# Patient Record
Sex: Male | Born: 1985 | Race: White | Hispanic: No | Marital: Married | Smoking: Never smoker
Health system: Southern US, Community
[De-identification: ages and names within clinical notes are randomized; demographics above are authoritative.]

---

## 2009-09-29 ENCOUNTER — Emergency Department (HOSPITAL_COMMUNITY): Admission: EM | Admit: 2009-09-29 | Discharge: 2009-09-29 | Payer: Self-pay | Admitting: Emergency Medicine

## 2011-01-11 LAB — STREP A DNA PROBE: Group A Strep Probe: NEGATIVE

## 2016-06-02 ENCOUNTER — Emergency Department: Payer: Worker's Compensation

## 2016-06-02 ENCOUNTER — Encounter: Payer: Self-pay | Admitting: Medical Oncology

## 2016-06-02 ENCOUNTER — Emergency Department
Admission: EM | Admit: 2016-06-02 | Discharge: 2016-06-02 | Disposition: A | Discharge status: Home / Self Care | Payer: Worker's Compensation | Attending: Emergency Medicine | Admitting: Emergency Medicine

## 2016-06-02 DIAGNOSIS — S39012A Strain of muscle, fascia and tendon of lower back, initial encounter: Secondary | ICD-10-CM

## 2016-06-02 DIAGNOSIS — X58XXXA Exposure to other specified factors, initial encounter: Secondary | ICD-10-CM | POA: Diagnosis not present

## 2016-06-02 DIAGNOSIS — M545 Low back pain: Secondary | ICD-10-CM | POA: Diagnosis present

## 2016-06-02 DIAGNOSIS — Y929 Unspecified place or not applicable: Secondary | ICD-10-CM | POA: Diagnosis not present

## 2016-06-02 DIAGNOSIS — Y9389 Activity, other specified: Secondary | ICD-10-CM | POA: Diagnosis not present

## 2016-06-02 DIAGNOSIS — Y990 Civilian activity done for income or pay: Secondary | ICD-10-CM | POA: Diagnosis not present

## 2016-06-02 DIAGNOSIS — M62830 Muscle spasm of back: Secondary | ICD-10-CM

## 2016-06-02 MED ORDER — IBUPROFEN 800 MG PO TABS: 800.0000 mg | ORAL_TABLET | Freq: Three times a day (TID) | ORAL | 0 refills | Status: DC | PRN

## 2016-06-02 MED ORDER — CYCLOBENZAPRINE HCL 10 MG PO TABS: 10.0000 mg | ORAL_TABLET | Freq: Three times a day (TID) | ORAL | 0 refills | Status: DC | PRN

## 2016-06-02 MED ORDER — HYDROMORPHONE HCL 1 MG/ML IJ SOLN
1.0000 mg | Freq: Once | INTRAMUSCULAR | Status: AC
  Administered 2016-06-02: 1 mg via INTRAMUSCULAR
  Filled 2016-06-02: qty 1

## 2016-06-02 MED ORDER — HYDROCODONE-ACETAMINOPHEN 5-325 MG PO TABS: 1.0000 | ORAL_TABLET | ORAL | 0 refills | Status: DC | PRN

## 2016-06-02 MED ORDER — KETOROLAC TROMETHAMINE 60 MG/2ML IM SOLN
60.0000 mg | Freq: Once | INTRAMUSCULAR | Status: AC
  Administered 2016-06-02: 60 mg via INTRAMUSCULAR
  Filled 2016-06-02: qty 2

## 2016-06-02 MED ORDER — DIAZEPAM 5 MG/ML IJ SOLN
5.0000 mg | Freq: Once | INTRAMUSCULAR | Status: AC
  Administered 2016-06-02: 5 mg via INTRAMUSCULAR
  Filled 2016-06-02: qty 2

## 2016-06-02 MED ORDER — PREDNISONE 10 MG PO TABS: 50.0000 mg | ORAL_TABLET | Freq: Every day | ORAL | 0 refills | Status: DC

## 2016-06-02 NOTE — ED Notes (Signed)
Pt in via triage; pt reports being at work today, stepping out of truck and feeling a sharp, shooting pain in lower back.  Pt denies any known injury, pt reports hx of pulled muscle a few years ago.  Pt A/Ox4, lying in bed face down for comfort.  Pt ambulatory to room, appears uncomfortable but no immediate distress noted at this time.

## 2016-06-02 NOTE — ED Provider Notes (Signed)
[facility] Emergency Department Provider Note  ____________________________________________  Time seen: Approximately 10:21 AM  I have reviewed the triage vital signs and the nursing notes.   HISTORY  Chief Complaint Back Pain    HPI Junius ArgyleRandy W Akard is a 30 y.o. male presents for evaluation of pain in his lower back. Patient states that he was getting out of his car when he felt a pain shooting across his lower back. States it happened this morning at work. Denies any other trauma. Denies any lifting straining or turning.   History reviewed. No pertinent past medical history.  There are no active problems to display for this patient.   History reviewed. No pertinent surgical history.  Prior to Admission medications   Medication Sig Start Date End Date Taking? Authorizing Provider  cyclobenzaprine (FLEXERIL) 10 MG tablet Take 1 tablet (10 mg total) by mouth 3 (three) times daily as needed for muscle spasms. 06/02/16   Evangeline Dakinharles M Beers, PA-C  HYDROcodone-acetaminophen (NORCO) 5-325 MG tablet Take 1-2 tablets by mouth every 4 (four) hours as needed for moderate pain. 06/02/16   Charmayne Sheerharles M Beers, PA-C  ibuprofen (ADVIL,MOTRIN) 800 MG tablet Take 1 tablet (800 mg total) by mouth every 8 (eight) hours as needed. 06/02/16   Evangeline Dakinharles M Beers, PA-C  predniSONE (DELTASONE) 10 MG tablet Take 5 tablets (50 mg total) by mouth daily with breakfast. 06/02/16   Evangeline Dakinharles M Beers, PA-C    Allergies Review of patient's allergies indicates no known allergies.  No family history on file.  Social History Social History  Substance Use Topics  . Smoking status: Not on file  . Smokeless tobacco: Not on file  . Alcohol use Not on file    Review of Systems Constitutional: No fever/chills Cardiovascular: Denies chest pain. Respiratory: Denies shortness of breath. Musculoskeletal: Positive for low back pain. Skin: Negative for rash. Neurological: Negative for headaches, focal  weakness or numbness.  10-point ROS otherwise negative.  ____________________________________________   PHYSICAL EXAM:  VITAL SIGNS: ED Triage Vitals  Enc Vitals Group     BP 06/02/16 0955 (!) 156/92     Pulse Rate 06/02/16 0955 78     Resp 06/02/16 0955 18     Temp --      Temp Source 06/02/16 0955 Oral     SpO2 06/02/16 0955 99 %     Weight 06/02/16 0931 263 lb (119.3 kg)     Height 06/02/16 0931 5\' 10"  (1.778 m)     Head Circumference --      Peak Flow --      Pain Score 06/02/16 0931 7     Pain Loc --      Pain Edu? --      Excl. in [place]? --     Constitutional: Alert and oriented. Well appearing and in Mild acute distress. Cardiovascular: Normal rate, regular rhythm. Grossly normal heart sounds.  Good peripheral circulation. Respiratory: Normal respiratory effort.  No retractions. Lungs CTAB. Musculoskeletal: Patient is lying in the prone position on the gurney and unable to rotate onto his back. Neurologic:  Normal speech and language. No gross focal neurologic deficits are appreciated. No gait instability. Skin:  Skin is warm, dry and intact. No rash noted. Psychiatric: Mood and affect are normal. Speech and behavior are normal.  ____________________________________________   LABS (all labs ordered are listed, but only abnormal results are displayed)  Labs Reviewed - No data to display ____________________________________________  EKG   ____________________________________________  RADIOLOGY  No acute osseous findings noted on x-ray. ____________________________________________   PROCEDURES  Procedure(s) performed: None  Critical Care performed: No  ____________________________________________   INITIAL IMPRESSION / ASSESSMENT AND PLAN / ED COURSE  Pertinent labs & imaging results that were available during my care of the patient were reviewed by me and considered in my medical decision making (see chart for details). Review of the [place] CSRS was  performed in accordance of the [facility] prior to dispensing any controlled drugs.  Acute lumbosacral strain. Rx given for Flexeril 10 mg 3 times a day, Percocet 5/325, Naprosyn 500 mg twice a day. Work excuse 48 hours. Patient follow-up with PCP or return to ER with any worsening symptomology.  Clinical Course  ----------------------------------------- 12:19 PM on 06/02/2016 -----------------------------------------  Patient still complaining of low back pain despite Valium and Toradol. 1 mg Dilaudid IM to be given at this time.  ____________________________________________   FINAL CLINICAL IMPRESSION(S) / ED DIAGNOSES  Final diagnoses:  Lumbar strain, initial encounter  Muscle spasm of back     This chart was dictated using voice recognition software/Dragon. Despite best efforts to proofread, errors can occur which can change the meaning. Any change was purely unintentional.    Evangeline Dakinharles M Beers, PA-C 06/02/16 1314    Emily FilbertJonathan E Williams, MD 06/02/16 [phone]

## 2016-06-02 NOTE — ED Triage Notes (Signed)
Pt was at work when he bent over and injured his lower back. WC needed.

## 2019-09-18 ENCOUNTER — Other Ambulatory Visit: Payer: Self-pay

## 2019-09-18 ENCOUNTER — Emergency Department
Admission: EM | Admit: 2019-09-18 | Discharge: 2019-09-18 | Disposition: A | Discharge status: Home / Self Care | Payer: BLUE CROSS/BLUE SHIELD | Attending: Emergency Medicine | Admitting: Emergency Medicine

## 2019-09-18 ENCOUNTER — Encounter: Payer: Self-pay | Admitting: Emergency Medicine

## 2019-09-18 DIAGNOSIS — R42 Dizziness and giddiness: Secondary | ICD-10-CM | POA: Diagnosis not present

## 2019-09-18 LAB — COMPREHENSIVE METABOLIC PANEL
ALT: 38 U/L (ref 0–44)
AST: 22 U/L (ref 15–41)
Albumin: 4.4 g/dL (ref 3.5–5.0)
Alkaline Phosphatase: 45 U/L (ref 38–126)
Anion gap: 9 (ref 5–15)
BUN: 12 mg/dL (ref 6–20)
CO2: 26 mmol/L (ref 22–32)
Calcium: 9 mg/dL (ref 8.9–10.3)
Chloride: 105 mmol/L (ref 98–111)
Creatinine, Ser: 1.03 mg/dL (ref 0.61–1.24)
GFR calc Af Amer: >60 mL/min (ref >60)
GFR calc non Af Amer: >60 mL/min (ref >60)
Glucose, Bld: 118 mg/dL — ABNORMAL HIGH (ref 70–99)
Potassium: 3.9 mmol/L (ref 3.5–5.1)
Sodium: 140 mmol/L (ref 135–145)
Total Bilirubin: 0.6 mg/dL (ref 0.3–1.2)
Total Protein: 7.5 g/dL (ref 6.5–8.1)

## 2019-09-18 LAB — CBC
HCT: 42.8 % (ref 39.0–52.0)
Hemoglobin: 14.6 g/dL (ref 13.0–17.0)
MCH: 29 pg (ref 26.0–34.0)
MCHC: 34.1 g/dL (ref 30.0–36.0)
MCV: 84.9 fL (ref 80.0–100.0)
Platelets: 187 10*3/uL (ref 150–400)
RBC: 5.04 MIL/uL (ref 4.22–5.81)
RDW: 12.5 % (ref 11.5–15.5)
WBC: 5.7 10*3/uL (ref 4.0–10.5)
nRBC: 0 % (ref 0.0–0.2)

## 2019-09-18 MED ORDER — ONDANSETRON 4 MG PO TBDP: 4.0000 mg | ORAL_TABLET | Freq: Three times a day (TID) | ORAL | 0 refills | Status: DC | PRN

## 2019-09-18 NOTE — ED Provider Notes (Signed)
[facility] Emergency Department Provider Note   ____________________________________________    I have reviewed the triage vital signs and the nursing notes.   HISTORY  Chief Complaint Dizziness     HPI Wesley Goodman is a 33 y.o. male who presents with complaints of dizziness.  He reports this is been occurring for 2 to 3 days now.  He was seen via virtual visit with physician who suspected vertigo and start him on meclizine, he reports mild improvement with this.  He describes a sensation of unsteadiness when moving his head especially.  Some nausea no vomiting.  No history of the same.  No head injury.  No neuro deficits.  No headache.  Here today because symptoms have continued  History reviewed. No pertinent past medical history.  There are no active problems to display for this patient.   History reviewed. No pertinent surgical history.  Prior to Admission medications   Medication Sig Start Date End Date Taking? Authorizing Provider  cyclobenzaprine (FLEXERIL) 10 MG tablet Take 1 tablet (10 mg total) by mouth 3 (three) times daily as needed for muscle spasms. 06/02/16   Beers, Pierce Crane, PA-C  HYDROcodone-acetaminophen (NORCO) 5-325 MG tablet Take 1-2 tablets by mouth every 4 (four) hours as needed for moderate pain. 06/02/16   Beers, Pierce Crane, PA-C  ibuprofen (ADVIL,MOTRIN) 800 MG tablet Take 1 tablet (800 mg total) by mouth every 8 (eight) hours as needed. 06/02/16   Beers, Pierce Crane, PA-C  ondansetron (ZOFRAN ODT) 4 MG disintegrating tablet Take 1 tablet (4 mg total) by mouth every 8 (eight) hours as needed. 09/18/19   Lavonia Drafts, MD  predniSONE (DELTASONE) 10 MG tablet Take 5 tablets (50 mg total) by mouth daily with breakfast. 06/02/16   Arlyss Repress, PA-C     Allergies Patient has no known allergies.  No family history on file.  Social History Social History   Tobacco Use  . Smoking status: Not on file  Substance Use Topics  .  Alcohol use: Not on file  . Drug use: Not on file    Review of Systems  Constitutional: No fever/chills Eyes: No visual changes.  ENT: No sore throat. Cardiovascular: Denies palpitations Respiratory: Denies shortness of breath. Gastrointestinal: No abdominal pain.   Genitourinary: Negative for dysuria. Musculoskeletal: No neck pain Skin: Negative for rash. Neurological: No headaches, no focal deficits   ____________________________________________   PHYSICAL EXAM:  VITAL SIGNS: ED Triage Vitals  Enc Vitals Group     BP 09/18/19 0825 (!) 148/98     Pulse Rate 09/18/19 0825 74     Resp 09/18/19 0825 16     Temp 09/18/19 0825 98 F (36.7 C)     Temp Source 09/18/19 0825 Oral     SpO2 09/18/19 0825 98 %     Weight 09/18/19 0827 113.4 kg (250 lb)     Height 09/18/19 0827 1.753 m (5\' 9" )     Head Circumference --      Peak Flow --      Pain Score 09/18/19 0832 0     Pain Loc --      Pain Edu? --      Excl. in [place]? --     Constitutional: Alert and oriented. Ears: Cerumen bilaterally inhibits visualization of TMs Eyes: Horizontal nystagmus left gaze Nose: No congestion/rhinnorhea. Mouth/Throat: Mucous membranes are moist.   Neck:  Painless ROM Cardiovascular: Normal rate, regular rhythm.  Good peripheral circulation. Respiratory: Normal respiratory  effort.  No retractions. Gastrointestinal: Soft and nontender. No distention.    Musculoskeletal:.  Warm and well perfused Neurologic:  Normal speech and language. No gross focal neurologic deficits are appreciated.  Skin:  Skin is warm, dry and intact. No rash noted. Psychiatric: Mood and affect are normal. Speech and behavior are normal.  ____________________________________________   LABS (all labs ordered are listed, but only abnormal results are displayed)  Labs Reviewed  COMPREHENSIVE METABOLIC PANEL - Abnormal; Notable for the following components:      Result Value   Glucose, Bld 118 (*)    All other  components within normal limits  CBC  CBG MONITORING, ED   ____________________________________________  EKG  ED ECG REPORT I, Jene Every, the attending physician, personally viewed and interpreted this ECG.  Date: 09/18/2019  Rhythm: normal sinus rhythm QRS Axis: normal Intervals: normal ST/T Wave abnormalities: normal Narrative Interpretation: no evidence of acute ischemia  ____________________________________________  RADIOLOGY  None ____________________________________________   PROCEDURES  Procedure(s) performed: No  Procedures   Critical Care performed: No ____________________________________________   INITIAL IMPRESSION / ASSESSMENT AND PLAN / ED COURSE  Pertinent labs & imaging results that were available during my care of the patient were reviewed by me and considered in my medical decision making (see chart for details).  Patient presents with symptoms consistent with vertigo, overall well-appearing with reassuring exam.  Prominent nystagmus horizontally to the left consistent with BPV.  Already taking meclizine, will add on Zofran, referral to ENT, lab work unremarkable, EKG unremarkable.    ____________________________________________   FINAL CLINICAL IMPRESSION(S) / ED DIAGNOSES  Final diagnoses:  Vertigo        Note:  This document was prepared using Dragon voice recognition software and may include unintentional dictation errors.   Jene Every, MD 09/18/19 1021

## 2019-09-18 NOTE — ED Notes (Signed)
Pt c/o dizziness with movement since Saturday and saw a teleDoc and has a Rx for meclizine yesterday. States it is not helping. States he thinks it is related to his ears, has been having issues with an ache intermittent for a while and has been doing home remedies like washing out with peroxide with minimal relief.

## 2019-09-18 NOTE — ED Triage Notes (Signed)
Pt reports Saturday started feeling dizzy. Pt also reports intermittent pain to both ears and nausea. Pt states called tele MD and was told he probably had vertigo. Pt states feels like the room is spinning.

## 2019-12-07 ENCOUNTER — Other Ambulatory Visit: Payer: Self-pay

## 2019-12-07 ENCOUNTER — Ambulatory Visit: Payer: BLUE CROSS/BLUE SHIELD | Attending: Internal Medicine

## 2019-12-07 DIAGNOSIS — Z20822 Contact with and (suspected) exposure to covid-19: Secondary | ICD-10-CM

## 2019-12-08 LAB — NOVEL CORONAVIRUS, NAA: SARS-CoV-2, NAA: NOT DETECTED

## 2021-09-01 ENCOUNTER — Emergency Department: Payer: Medicaid Other

## 2021-09-01 ENCOUNTER — Encounter: Payer: Self-pay | Admitting: Emergency Medicine

## 2021-09-01 ENCOUNTER — Other Ambulatory Visit: Payer: Self-pay

## 2021-09-01 ENCOUNTER — Emergency Department
Admission: EM | Admit: 2021-09-01 | Discharge: 2021-09-01 | Disposition: A | Discharge status: Home / Self Care | Payer: Medicaid Other | Attending: Emergency Medicine | Admitting: Emergency Medicine

## 2021-09-01 DIAGNOSIS — Z20822 Contact with and (suspected) exposure to covid-19: Secondary | ICD-10-CM | POA: Insufficient documentation

## 2021-09-01 DIAGNOSIS — J40 Bronchitis, not specified as acute or chronic: Secondary | ICD-10-CM | POA: Diagnosis not present

## 2021-09-01 DIAGNOSIS — R059 Cough, unspecified: Secondary | ICD-10-CM | POA: Diagnosis present

## 2021-09-01 LAB — RESP PANEL BY RT-PCR (FLU A&B, COVID) ARPGX2
Influenza A by PCR: NEGATIVE
Influenza B by PCR: NEGATIVE
SARS Coronavirus 2 by RT PCR: NEGATIVE

## 2021-09-01 MED ORDER — AZITHROMYCIN 250 MG PO TABS: ORAL_TABLET | ORAL | 0 refills | Status: AC

## 2021-09-01 MED ORDER — BENZONATATE 100 MG PO CAPS
100.0000 mg | ORAL_CAPSULE | Freq: Three times a day (TID) | ORAL | 0 refills | Status: AC | PRN
Start: 2021-09-01 — End: 2022-09-01

## 2021-09-01 MED ORDER — ALBUTEROL SULFATE HFA 108 (90 BASE) MCG/ACT IN AERS: 2.0000 | INHALATION_SPRAY | Freq: Four times a day (QID) | RESPIRATORY_TRACT | 2 refills | Status: AC | PRN

## 2021-09-01 NOTE — Discharge Instructions (Addendum)
The Zithromax antibiotic 2 pills on the first day and 1 pill every day after that till they are gone.  Use the inhaler 2 puffs 4 times a day as we discussed puffing the inhaler and inhaling it in the middle of a slow long deep breath.  Use the Tessalon Perles swallow 1 3 times a day as needed for cough.  Remember the Jerilynn Som can make you very sick if you suck on them.  Please return for any increasing shortness of breath, fever or feeling sicker.  Please get your doctor to check your blood pressure again a week or 2 after you have gotten better as it is high today.

## 2021-09-01 NOTE — ED Provider Notes (Signed)
[facility] Emergency Department Provider Note   ____________________________________________   Event Date/Time   First MD Initiated Contact with Patient 09/01/21 1313     (approximate)  I have reviewed the triage vital signs and the nursing notes.   HISTORY  Chief Complaint URI    HPI Wesley Goodman is a 35 y.o. male complains of 5 days of cough and cold with chills.  He has 1 child in school and 1 in daycare and both have gotten sick and his wife I believe is also sick and now he is sick as well.  They all have similar symptoms.  Patient is not having any shortness of breath.        History reviewed. No pertinent past medical history.  There are no problems to display for this patient.   History reviewed. No pertinent surgical history.  Prior to Admission medications   Medication Sig Start Date End Date Taking? Authorizing Provider  albuterol (VENTOLIN HFA) 108 (90 Base) MCG/ACT inhaler Inhale 2 puffs into the lungs every 6 (six) hours as needed for wheezing or shortness of breath. 09/01/21  Yes Arnaldo Natal, MD  azithromycin (ZITHROMAX Z-PAK) 250 MG tablet Take 2 tablets (500 mg) on  Day 1,  followed by 1 tablet (250 mg) once daily on Days 2 through 5. 09/01/21 09/06/21 Yes Arnaldo Natal, MD  benzonatate (TESSALON PERLES) 100 MG capsule Take 1 capsule (100 mg total) by mouth 3 (three) times daily as needed for cough. 09/01/21 09/01/22 Yes Arnaldo Natal, MD  cyclobenzaprine (FLEXERIL) 10 MG tablet Take 1 tablet (10 mg total) by mouth 3 (three) times daily as needed for muscle spasms. 06/02/16   Beers, Charmayne Sheer, PA-C  HYDROcodone-acetaminophen (NORCO) 5-325 MG tablet Take 1-2 tablets by mouth every 4 (four) hours as needed for moderate pain. 06/02/16   Beers, Charmayne Sheer, PA-C  ibuprofen (ADVIL,MOTRIN) 800 MG tablet Take 1 tablet (800 mg total) by mouth every 8 (eight) hours as needed. 06/02/16   Beers, Charmayne Sheer, PA-C  ondansetron (ZOFRAN ODT) 4 MG  disintegrating tablet Take 1 tablet (4 mg total) by mouth every 8 (eight) hours as needed. 09/18/19   Jene Every, MD  predniSONE (DELTASONE) 10 MG tablet Take 5 tablets (50 mg total) by mouth daily with breakfast. 06/02/16   Evangeline Dakin, PA-C    Allergies Patient has no known allergies.  No family history on file.  Social History    Review of Systems  Constitutional: No fever/chills Eyes: No visual changes. ENT: No sore throat. Cardiovascular: Denies chest pain. Respiratory: Denies shortness of breath. Gastrointestinal: No abdominal pain.  No nausea, no vomiting.  No diarrhea.  No constipation. Genitourinary: Negative for dysuria. Musculoskeletal: Negative for back pain. Skin: Negative for rash. Neurological: Negative for headaches, focal weakness   ____________________________________________   PHYSICAL EXAM:  VITAL SIGNS: ED Triage Vitals  Enc Vitals Group     BP 09/01/21 1242 (!) 164/106     Pulse Rate 09/01/21 1242 74     Resp 09/01/21 1242 18     Temp 09/01/21 1242 98.5 F (36.9 C)     Temp Source 09/01/21 1242 Oral     SpO2 09/01/21 1242 98 %     Weight 09/01/21 1239 250 lb (113.4 kg)     Height 09/01/21 1239 5\' 9"  (1.753 m)     Head Circumference --      Peak Flow --      Pain Score 09/01/21  1239 0     Pain Loc --      Pain Edu? --      Excl. in [place]? --    Constitutional: Alert and oriented. Well appearing and in no acute distress. Eyes: Conjunctivae are normal. Head: Atraumatic. Nose: No congestion/rhinnorhea. Mouth/Throat: Mucous membranes are moist.  Oropharynx non-erythematous. Neck: No stridor.  Cardiovascular: Normal rate, regular rhythm. Grossly normal heart sounds.  Good peripheral circulation. Respiratory: Normal respiratory effort.  No retractions. Lungs slight crackles in both bases otherwise clear Gastrointestinal: Soft and nontender. No distention. No abdominal bruits.  Musculoskeletal: No lower extremity tenderness n Neurologic:   Normal speech and language. No gross focal neurologic deficits are appreciated.  Skin:  Skin is warm, dry and intact. No rash noted.   ____________________________________________   LABS (all labs ordered are listed, but only abnormal results are displayed)  Labs Reviewed  RESP PANEL BY RT-PCR (FLU A&B, COVID) [id number]   ____________________________________________  EKG   ____________________________________________  RADIOLOGY Jill Poling, personally viewed and evaluated these images (plain radiographs) as part of my medical decision making, as well as reviewing the written report by the radiologist.  ED MD interpretation: Chest x-ray read by radiology reviewed by me is negative  Official radiology report(s): DG Chest 2 View  Result Date: 09/01/2021 CLINICAL DATA:  Cough. EXAM: CHEST - 2 VIEW COMPARISON:  September 29, 2009. FINDINGS: The heart size and mediastinal contours are within normal limits. Both lungs are clear. The visualized skeletal structures are unremarkable. IMPRESSION: No active cardiopulmonary disease. Electronically Signed   By: Lupita Raider M.D.   On: 09/01/2021 13:47    ____________________________________________   PROCEDURES  Procedure(s) performed (including Critical Care):  Procedures   ____________________________________________   INITIAL IMPRESSION / ASSESSMENT AND PLAN / ED COURSE  Patient reports coughing up green phlegm.  He feels like he is getting worse even with here in the emergency department.  He is coughed up some streaks of blood just now.  He does not have any maxillary or frontal sinus tenderness.  He does have complaints of some pain in the ethmoid area.  I will try some Zithromax antibiotic together with albuterol and Tessalon Perles.  He will return if he is worse or not any better in 2 to 3 days.  I will also have him follow-up with his primary care doctor to make sure his blood pressure comes down.               ____________________________________________   FINAL CLINICAL IMPRESSION(S) / ED DIAGNOSES  Final diagnoses:  Bronchitis     ED Discharge Orders          Ordered    albuterol (VENTOLIN HFA) 108 (90 Base) MCG/ACT inhaler  Every 6 hours PRN        09/01/21 1422    azithromycin (ZITHROMAX Z-PAK) 250 MG tablet        09/01/21 1422    benzonatate (TESSALON PERLES) 100 MG capsule  3 times daily PRN        09/01/21 1422             Note:  This document was prepared using Dragon voice recognition software and may include unintentional dictation errors.    Arnaldo Natal, MD 09/01/21 [phone]

## 2021-09-01 NOTE — ED Triage Notes (Signed)
C/O productive cough, sinus congestion, chills x 5 days.

## 2021-09-28 ENCOUNTER — Encounter: Payer: Self-pay | Admitting: Emergency Medicine

## 2021-09-28 ENCOUNTER — Emergency Department: Payer: Medicaid Other

## 2021-09-28 ENCOUNTER — Other Ambulatory Visit: Payer: Self-pay

## 2021-09-28 DIAGNOSIS — U071 COVID-19: Secondary | ICD-10-CM | POA: Insufficient documentation

## 2021-09-28 DIAGNOSIS — R059 Cough, unspecified: Secondary | ICD-10-CM | POA: Diagnosis present

## 2021-09-28 DIAGNOSIS — R000 Tachycardia, unspecified: Secondary | ICD-10-CM | POA: Diagnosis not present

## 2021-09-28 LAB — BASIC METABOLIC PANEL
Anion gap: 9 (ref 5–15)
BUN: 16 mg/dL (ref 6–20)
CO2: 25 mmol/L (ref 22–32)
Calcium: 10.2 mg/dL (ref 8.9–10.3)
Chloride: 99 mmol/L (ref 98–111)
Creatinine, Ser: 1.02 mg/dL (ref 0.61–1.24)
GFR, Estimated: >60 mL/min (ref >60)
Glucose, Bld: 127 mg/dL — ABNORMAL HIGH (ref 70–99)
Potassium: 4.8 mmol/L (ref 3.5–5.1)
Sodium: 133 mmol/L — ABNORMAL LOW (ref 135–145)

## 2021-09-28 LAB — CBC WITH DIFFERENTIAL/PLATELET
Abs Immature Granulocytes: 0.02 10*3/uL (ref 0.00–0.07)
Basophils Absolute: 0.1 10*3/uL (ref 0.0–0.1)
Basophils Relative: 1 %
Eosinophils Absolute: 0.1 10*3/uL (ref 0.0–0.5)
Eosinophils Relative: 1 %
HCT: 44.8 % (ref 39.0–52.0)
Hemoglobin: 15.2 g/dL (ref 13.0–17.0)
Immature Granulocytes: 0 %
Lymphocytes Relative: 9 %
Lymphs Abs: 0.7 10*3/uL (ref 0.7–4.0)
MCH: 28.5 pg (ref 26.0–34.0)
MCHC: 33.9 g/dL (ref 30.0–36.0)
MCV: 83.9 fL (ref 80.0–100.0)
Monocytes Absolute: 0.7 10*3/uL (ref 0.1–1.0)
Monocytes Relative: 9 %
Neutro Abs: 6 10*3/uL (ref 1.7–7.7)
Neutrophils Relative %: 80 %
Platelets: 219 10*3/uL (ref 150–400)
RBC: 5.34 MIL/uL (ref 4.22–5.81)
RDW: 13.1 % (ref 11.5–15.5)
WBC: 7.5 10*3/uL (ref 4.0–10.5)
nRBC: 0 % (ref 0.0–0.2)

## 2021-09-28 LAB — TROPONIN I (HIGH SENSITIVITY): Troponin I (High Sensitivity): 2 ng/L (ref <18)

## 2021-09-28 NOTE — ED Triage Notes (Signed)
Patient ambulatory to triage with steady gait, without difficulty or distress noted; pt reports tonight having dry cough accomp by dizziness and mid CP, nonradiating

## 2021-09-29 ENCOUNTER — Emergency Department
Admission: EM | Admit: 2021-09-29 | Discharge: 2021-09-29 | Disposition: A | Discharge status: Home / Self Care | Payer: Medicaid Other | Attending: Emergency Medicine | Admitting: Emergency Medicine

## 2021-09-29 DIAGNOSIS — R051 Acute cough: Secondary | ICD-10-CM

## 2021-09-29 DIAGNOSIS — R42 Dizziness and giddiness: Secondary | ICD-10-CM

## 2021-09-29 DIAGNOSIS — R0789 Other chest pain: Secondary | ICD-10-CM

## 2021-09-29 DIAGNOSIS — U071 COVID-19: Secondary | ICD-10-CM

## 2021-09-29 LAB — RESP PANEL BY RT-PCR (FLU A&B, COVID) ARPGX2
Influenza A by PCR: NEGATIVE
Influenza B by PCR: NEGATIVE
SARS Coronavirus 2 by RT PCR: POSITIVE — AB

## 2021-09-29 LAB — TROPONIN I (HIGH SENSITIVITY): Troponin I (High Sensitivity): 2 ng/L (ref <18)

## 2021-09-29 MED ORDER — ONDANSETRON 8 MG PO TBDP: 8.0000 mg | ORAL_TABLET | Freq: Three times a day (TID) | ORAL | 0 refills | Status: DC | PRN

## 2021-09-29 MED ORDER — NIRMATRELVIR/RITONAVIR (PAXLOVID)TABLET: 3.0000 | ORAL_TABLET | Freq: Two times a day (BID) | ORAL | 0 refills | Status: AC

## 2021-09-29 NOTE — ED Provider Notes (Signed)
[facility] Emergency Department Provider Note   ____________________________________________   Event Date/Time   First MD Initiated Contact with Patient 09/29/21 [phone]     (approximate)  I have reviewed the triage vital signs and the nursing notes.   HISTORY  Chief Complaint Chest Pain    HPI Wesley Goodman is a 35 y.o. male who presents for cough, chest pain, and lightheadedness  LOCATION: Chest DURATION: 1 day prior to arrival TIMING: Worsening since onset SEVERITY: Severe QUALITY: Chest pain, dry cough CONTEXT: Patient states that beginning last night he began experiencing dry cough, substernal chest pain that he describes as aching as well as orthostatic lightheadedness beginning last night at work MODIFYING FACTORS: Denies any exacerbating or relieving factors and has not tried any medications for the symptoms ASSOCIATED SYMPTOMS: Clear rhinorrhea, shortness of breath   Per medical record review, patient has no listed past medical or surgical history          History reviewed. No pertinent past medical history.  There are no problems to display for this patient.   History reviewed. No pertinent surgical history.  Prior to Admission medications   Medication Sig Start Date End Date Taking? Authorizing Provider  albuterol (VENTOLIN HFA) 108 (90 Base) MCG/ACT inhaler Inhale 2 puffs into the lungs every 6 (six) hours as needed for wheezing or shortness of breath. 09/01/21   Arnaldo Natal, MD  benzonatate (TESSALON PERLES) 100 MG capsule Take 1 capsule (100 mg total) by mouth 3 (three) times daily as needed for cough. 09/01/21 09/01/22  Arnaldo Natal, MD  cyclobenzaprine (FLEXERIL) 10 MG tablet Take 1 tablet (10 mg total) by mouth 3 (three) times daily as needed for muscle spasms. 06/02/16   Beers, Charmayne Sheer, PA-C  HYDROcodone-acetaminophen (NORCO) 5-325 MG tablet Take 1-2 tablets by mouth every 4 (four) hours as needed for moderate pain.  06/02/16   Beers, Charmayne Sheer, PA-C  ibuprofen (ADVIL,MOTRIN) 800 MG tablet Take 1 tablet (800 mg total) by mouth every 8 (eight) hours as needed. 06/02/16   Beers, Charmayne Sheer, PA-C  nirmatrelvir/ritonavir EUA (PAXLOVID) 20 x 150 MG & 10 x 100MG  TABS Take 3 tablets by mouth 2 (two) times daily for 5 days. Patient GFR is WNL. Take nirmatrelvir (150 mg) two tablets twice daily for 5 days and ritonavir (100 mg) one tablet twice daily for 5 days. 09/29/21 10/04/21 Yes 10/06/21, MD  ondansetron (ZOFRAN-ODT) 8 MG disintegrating tablet Take 1 tablet (8 mg total) by mouth every 8 (eight) hours as needed for nausea or vomiting. 09/29/21  Yes 10/01/21, MD  predniSONE (DELTASONE) 10 MG tablet Take 5 tablets (50 mg total) by mouth daily with breakfast. 06/02/16   06/04/16, PA-C    Allergies Patient has no known allergies.  No family history on file.  Social History Social History   Tobacco Use   Smoking status: Never   Smokeless tobacco: Never  Vaping Use   Vaping Use: Never used  Substance Use Topics   Alcohol use: Yes   Drug use: Never    Review of Systems Constitutional: Endorses subjective fever/chills Eyes: No visual changes. ENT: Endorses dry cough and sore throat. Cardiovascular: Endorses chest pain. Respiratory: Endorses shortness of breath. Gastrointestinal: No abdominal pain.  No nausea, no vomiting.  No diarrhea. Genitourinary: Negative for dysuria. Musculoskeletal: Negative for acute arthralgias Skin: Negative for rash. Neurological: Negative for headaches, weakness/numbness/paresthesias in any extremity Psychiatric: Negative for suicidal ideation/homicidal ideation ____________________________________________  PHYSICAL EXAM:  VITAL SIGNS: ED Triage Vitals  Enc Vitals Group     BP 09/28/21 2242 110/76     Pulse Rate 09/28/21 2242 (!) 114     Resp 09/28/21 2242 20     Temp 09/28/21 2242 98.7 F (37.1 C)     Temp Source 09/28/21 2242 Oral     SpO2  09/28/21 2242 98 %     Weight 09/28/21 2242 250 lb (113.4 kg)     Height 09/28/21 2242 5\' 9"  (1.753 m)     Head Circumference --      Peak Flow --      Pain Score 09/28/21 2241 5     Pain Loc --      Pain Edu? --      Excl. in GC? --    Constitutional: Alert and oriented. Well appearing and in no acute distress. Eyes: Conjunctivae are injected. PERRL. Head: Atraumatic. Nose: No congestion/rhinnorhea. Mouth/Throat: Mucous membranes are moist. Neck: No stridor Cardiovascular: Tachycardic.  Grossly normal heart sounds.  Good peripheral circulation. Respiratory: Normal respiratory effort.  No retractions. Gastrointestinal: Soft and nontender. No distention. Musculoskeletal: No obvious deformities Neurologic:  Normal speech and language. No gross focal neurologic deficits are appreciated. Skin:  Skin is warm and dry. No rash noted. Psychiatric: Mood and affect are normal. Speech and behavior are normal.  ____________________________________________   LABS (all labs ordered are listed, but only abnormal results are displayed)  Labs Reviewed  RESP PANEL BY RT-PCR (FLU A&B, COVID) ARPGX2 - Abnormal; Notable for the following components:      Result Value   SARS Coronavirus 2 by RT PCR POSITIVE (*)    All other components within normal limits  BASIC METABOLIC PANEL - Abnormal; Notable for the following components:   Sodium 133 (*)    Glucose, Bld 127 (*)    All other components within normal limits  CBC WITH DIFFERENTIAL/PLATELET  TROPONIN I (HIGH SENSITIVITY)  TROPONIN I (HIGH SENSITIVITY)   ____________________________________________  EKG  ED ECG REPORT I, 09/30/21, the attending physician, personally viewed and interpreted this ECG.  Date: 09/29/2021 EKG Time: 2247 Rate: 114 Rhythm: Tachycardic sinus rhythm QRS Axis: normal Intervals: normal ST/T Wave abnormalities: normal Narrative Interpretation: Tachycardic sinus rhythm.  No evidence of acute  ischemia  ____________________________________________  RADIOLOGY  ED MD interpretation: 2 view chest x-ray shows no evidence of acute abnormalities including no pneumonia, pneumothorax, or widened mediastinum  Official radiology report(s): DG Chest 2 View  Result Date: 09/28/2021 CLINICAL DATA:  Cough, dizziness, mid chest pain EXAM: CHEST - 2 VIEW COMPARISON:  09/01/2021 FINDINGS: The heart size and mediastinal contours are within normal limits. Both lungs are clear. The visualized skeletal structures are unremarkable. IMPRESSION: No active cardiopulmonary disease. Electronically Signed   By: 09/03/2021 M.D.   On: 09/28/2021 23:09    ____________________________________________   PROCEDURES  Procedure(s) performed (including Critical Care):  Procedures   ____________________________________________   INITIAL IMPRESSION / ASSESSMENT AND PLAN / ED COURSE  As part of my medical decision making, I reviewed the following data within the electronic medical record, if available:  Nursing notes reviewed and incorporated, Labs reviewed, EKG interpreted, Old chart reviewed, Radiograph reviewed and Notes from prior ED visits reviewed and incorporated        Presentation most consistent with Viral Syndrome.  Patient has tested positive for COVID-19. Based on vitals and exam they are nontoxic and stable for discharge.  Given History and Exam I have  a lower suspicion for: Emergent CardioPulmonary causes [such as Acute Asthma or COPD Exacerbation, acute Heart Failure or exacerbation, PE, PTX, atypical ACS, PNA]. Emergent Otolaryngeal causes [such as PTA, RPA, Ludwigs, Epiglottitis, EBV].  Regarding Emergent Travel or Immunosuppressive related infectious: I have a low suspicion for acute HIV.  Will provide strict return precautions and instructions on self-isolation/quarantine and anticipatory guidance.      ____________________________________________   FINAL CLINICAL  IMPRESSION(S) / ED DIAGNOSES  Final diagnoses:  COVID-19 virus infection  Chest wall pain  Acute cough  Episodic lightheadedness     ED Discharge Orders          Ordered    nirmatrelvir/ritonavir EUA (PAXLOVID) 20 x 150 MG & 10 x 100MG  TABS  2 times daily        09/29/21 0557    ondansetron (ZOFRAN-ODT) 8 MG disintegrating tablet  Every 8 hours PRN        09/29/21 0557             Note:  This document was prepared using Dragon voice recognition software and may include unintentional dictation errors.    10/01/21, MD 09/29/21 [phone]

## 2021-11-15 ENCOUNTER — Emergency Department: Payer: Medicaid Other

## 2021-11-15 ENCOUNTER — Emergency Department
Admission: EM | Admit: 2021-11-15 | Discharge: 2021-11-16 | Disposition: A | Discharge status: Home / Self Care | Payer: Medicaid Other | Attending: Emergency Medicine | Admitting: Emergency Medicine

## 2021-11-15 DIAGNOSIS — R0981 Nasal congestion: Secondary | ICD-10-CM | POA: Insufficient documentation

## 2021-11-15 DIAGNOSIS — R531 Weakness: Secondary | ICD-10-CM | POA: Diagnosis not present

## 2021-11-15 DIAGNOSIS — Z20822 Contact with and (suspected) exposure to covid-19: Secondary | ICD-10-CM | POA: Diagnosis not present

## 2021-11-15 DIAGNOSIS — R42 Dizziness and giddiness: Secondary | ICD-10-CM | POA: Insufficient documentation

## 2021-11-15 DIAGNOSIS — R0789 Other chest pain: Secondary | ICD-10-CM | POA: Diagnosis not present

## 2021-11-15 DIAGNOSIS — R079 Chest pain, unspecified: Secondary | ICD-10-CM | POA: Diagnosis present

## 2021-11-15 LAB — COMPREHENSIVE METABOLIC PANEL
ALT: 26 U/L (ref 0–44)
AST: 26 U/L (ref 15–41)
Albumin: 4.3 g/dL (ref 3.5–5.0)
Alkaline Phosphatase: 44 U/L (ref 38–126)
Anion gap: 7 (ref 5–15)
BUN: 14 mg/dL (ref 6–20)
CO2: 22 mmol/L (ref 22–32)
Calcium: 9.2 mg/dL (ref 8.9–10.3)
Chloride: 105 mmol/L (ref 98–111)
Creatinine, Ser: 0.96 mg/dL (ref 0.61–1.24)
GFR, Estimated: >60 mL/min (ref >60)
Glucose, Bld: 103 mg/dL — ABNORMAL HIGH (ref 70–99)
Potassium: 4 mmol/L (ref 3.5–5.1)
Sodium: 134 mmol/L — ABNORMAL LOW (ref 135–145)
Total Bilirubin: 1.9 mg/dL — ABNORMAL HIGH (ref 0.3–1.2)
Total Protein: 7.2 g/dL (ref 6.5–8.1)

## 2021-11-15 LAB — RESP PANEL BY RT-PCR (FLU A&B, COVID) ARPGX2
Influenza A by PCR: NEGATIVE
Influenza B by PCR: NEGATIVE
SARS Coronavirus 2 by RT PCR: NEGATIVE

## 2021-11-15 LAB — CBC WITH DIFFERENTIAL/PLATELET
Abs Immature Granulocytes: 0.01 10*3/uL (ref 0.00–0.07)
Basophils Absolute: 0 10*3/uL (ref 0.0–0.1)
Basophils Relative: 1 %
Eosinophils Absolute: 0.1 10*3/uL (ref 0.0–0.5)
Eosinophils Relative: 1 %
HCT: 39.1 % (ref 39.0–52.0)
Hemoglobin: 13.6 g/dL (ref 13.0–17.0)
Immature Granulocytes: 0 %
Lymphocytes Relative: 12 %
Lymphs Abs: 0.7 10*3/uL (ref 0.7–4.0)
MCH: 28.8 pg (ref 26.0–34.0)
MCHC: 34.8 g/dL (ref 30.0–36.0)
MCV: 82.8 fL (ref 80.0–100.0)
Monocytes Absolute: 0.5 10*3/uL (ref 0.1–1.0)
Monocytes Relative: 8 %
Neutro Abs: 4.7 10*3/uL (ref 1.7–7.7)
Neutrophils Relative %: 78 %
Platelets: 221 10*3/uL (ref 150–400)
RBC: 4.72 MIL/uL (ref 4.22–5.81)
RDW: 13 % (ref 11.5–15.5)
WBC: 6 10*3/uL (ref 4.0–10.5)
nRBC: 0 % (ref 0.0–0.2)

## 2021-11-15 LAB — TROPONIN I (HIGH SENSITIVITY): Troponin I (High Sensitivity): <2 ng/L (ref <18)

## 2021-11-15 MED ORDER — KETOROLAC TROMETHAMINE 30 MG/ML IJ SOLN
15.0000 mg | Freq: Once | INTRAMUSCULAR | Status: AC
  Administered 2021-11-16: 15 mg via INTRAVENOUS
  Filled 2021-11-15: qty 1

## 2021-11-15 MED ORDER — LACTATED RINGERS IV BOLUS
1000.0000 mL | Freq: Once | INTRAVENOUS | Status: AC
  Administered 2021-11-15: 1000 mL via INTRAVENOUS

## 2021-11-15 NOTE — ED Provider Notes (Signed)
[facility] Provider Note    Event Date/Time   First MD Initiated Contact with Patient 11/15/21 2219     (approximate)   History   Chest Pain   HPI  Wesley Goodman is a 36 y.o. male who presents to the ED for evaluation of Chest Pain   Pt presents to the ED from his workplace for evaluation of generalized weakness, congestion, chest pain and dizziness.   He reports feeling badly yesterday with generalized weakness, upper respiratory congestion and postnasal drip.  Did not have much of an appetite this morning, and had a small breakfast.  He has been at work all day and he reports developing presyncopal lightheaded dizziness and worsening generalized weakness, his coworkers called 911 because he did not look good.  He reports feeling little bit better now, just generalized weakness all over and some respiratory congestion.  Chest pain has improved since he arrived to the ED.  Physical Exam   Triage Vital Signs: ED Triage Vitals [11/15/21 2218]  Enc Vitals Group     BP 129/78     Pulse Rate 89     Resp 16     Temp 98.2 F (36.8 C)     Temp Source Oral     SpO2 98 %     Weight 209 lb 14.4 oz (95.2 kg)     Height      Head Circumference      Peak Flow      Pain Score      Pain Loc      Pain Edu?      Excl. in GC?     Most recent vital signs: Vitals:   11/15/21 2218  BP: 129/78  Pulse: 89  Resp: 16  Temp: 98.2 F (36.8 C)  SpO2: 98%    General: Awake, no distress.  CV:  Good peripheral perfusion. RRR Resp:  Normal effort.  Abd:  No distention.  MSK:  No deformity noted.  Neuro:  No focal deficits appreciated. Cranial nerves II through XII intact 5/5 strength and sensation in all 4 extremities Other:     ED Results / Procedures / Treatments   Labs (all labs ordered are listed, but only abnormal results are displayed) Labs Reviewed  COMPREHENSIVE METABOLIC PANEL - Abnormal; Notable for the following components:      Result Value    Sodium 134 (*)    Glucose, Bld 103 (*)    Total Bilirubin 1.9 (*)    All other components within normal limits  RESP PANEL BY RT-PCR (FLU A&B, COVID) ARPGX2  CBC WITH DIFFERENTIAL/PLATELET  D-DIMER, QUANTITATIVE  TROPONIN I (HIGH SENSITIVITY)    EKG Sinus rhythm, rate of 88 bpm.  Normal axis and intervals.  Nonspecific ST changes to inferior leads without STEMI.  RADIOLOGY CXR reviewed by me without evidence of acute cardiopulmonary pathology.  Official radiology report(s): DG Chest 2 View  Result Date: 11/15/2021 CLINICAL DATA:  Chest pain, shortness of breath EXAM: CHEST - 2 VIEW COMPARISON:  09/28/2021 FINDINGS: The heart size and mediastinal contours are within normal limits. Both lungs are clear. The visualized skeletal structures are unremarkable. IMPRESSION: No active cardiopulmonary disease. Electronically Signed   By: Charlett Nose M.D.   On: 11/15/2021 22:53    PROCEDURES and INTERVENTIONS:  .1-3 Lead EKG Interpretation Performed by: Delton Prairie, MD Authorized by: Delton Prairie, MD     Interpretation: normal     ECG rate:  84   ECG  rate assessment: normal     Rhythm: sinus rhythm     Ectopy: none     Conduction: normal    Medications  ketorolac (TORADOL) 30 MG/ML injection 15 mg (has no administration in time range)  lactated ringers bolus 1,000 mL (1,000 mLs Intravenous New Bag/Given 11/15/21 2250)     IMPRESSION / MDM / ASSESSMENT AND PLAN / ED COURSE  I reviewed the triage vital signs and the nursing notes.  36 year old male presents to the ED from work with generalized weakness, chest pain and dizziness.  He has normal vitals on room air and reassuring initial blood work with normal CBC and CMP.  For troponin is negative and his EKG is nonischemic.  Considering his constellation of symptoms, D-dimer was sent to evaluate for acute PE.  Patient signed out to oncoming provider to follow-up on this study and reassess the patient.  If D-dimer is negative, anticipate  he be suitable for outpatient management.      FINAL CLINICAL IMPRESSION(S) / ED DIAGNOSES   Final diagnoses:  Other chest pain     Rx / DC Orders   ED Discharge Orders     None        Note:  This document was prepared using Dragon voice recognition software and may include unintentional dictation errors.   Delton Prairie, MD 11/15/21 [phone]

## 2021-11-15 NOTE — ED Triage Notes (Signed)
Pt arrived via EMS. Pt was at work and started to feel chest pain that turned into sob and weakness. Per EMS " Pt has been having a five run of PVC" Pt was placed on 2L of O2 via [place] and his PVC rate is minimal with 324 of aspirin. Pt does have a 20g to the right outer wrist. Pt is NAD, A/Ox4, resting on ED stretcher.

## 2021-11-16 LAB — D-DIMER, QUANTITATIVE: D-Dimer, Quant: <0.27 ug/mL-FEU (ref 0.00–0.50)

## 2021-11-16 NOTE — ED Notes (Signed)
Patient provided with discharge instructions. Patient verbalized understanding. Patient ambulatory with a steady gait out to the waiting room

## 2021-11-16 NOTE — ED Provider Notes (Addendum)
I was asked by Dr. Vladimir Crofts to follow-up the results of the d-dimer and if negative plan to dc home on supportive care. D-dimer is negative, Patient feels improved after IV toradol.  Did review patient's EKG and lab work which were all unremarkable.  I went over the results with the patient.  I discussed follow-up with primary care doctor and our standard return precautions for any signs of worsening chest pain or shortness of breath.   Alfred Levins, [place], MD 11/16/21 [place], [place], MD 11/16/21 [phone]

## 2021-11-16 NOTE — ED Notes (Signed)
Patient resting comfortably on stretcher with eyes closed. RR even and unlabored. Patient verbalizes no needs or complaints at this time.  °

## 2021-11-24 ENCOUNTER — Emergency Department
Admission: EM | Admit: 2021-11-24 | Discharge: 2021-11-25 | Disposition: A | Discharge status: Home / Self Care | Payer: Medicaid Other | Attending: Emergency Medicine | Admitting: Emergency Medicine

## 2021-11-24 ENCOUNTER — Other Ambulatory Visit: Payer: Self-pay

## 2021-11-24 ENCOUNTER — Emergency Department: Payer: Medicaid Other

## 2021-11-24 DIAGNOSIS — R079 Chest pain, unspecified: Secondary | ICD-10-CM

## 2021-11-24 DIAGNOSIS — R0789 Other chest pain: Secondary | ICD-10-CM | POA: Insufficient documentation

## 2021-11-24 LAB — TROPONIN I (HIGH SENSITIVITY)
Troponin I (High Sensitivity): 3 ng/L (ref <18)
Troponin I (High Sensitivity): <2 ng/L (ref <18)

## 2021-11-24 LAB — COMPREHENSIVE METABOLIC PANEL
ALT: 18 U/L (ref 0–44)
AST: 18 U/L (ref 15–41)
Albumin: 4.3 g/dL (ref 3.5–5.0)
Alkaline Phosphatase: 50 U/L (ref 38–126)
Anion gap: 7 (ref 5–15)
BUN: 11 mg/dL (ref 6–20)
CO2: 24 mmol/L (ref 22–32)
Calcium: 9.4 mg/dL (ref 8.9–10.3)
Chloride: 108 mmol/L (ref 98–111)
Creatinine, Ser: 0.81 mg/dL (ref 0.61–1.24)
GFR, Estimated: >60 mL/min (ref >60)
Glucose, Bld: 100 mg/dL — ABNORMAL HIGH (ref 70–99)
Potassium: 4.6 mmol/L (ref 3.5–5.1)
Sodium: 139 mmol/L (ref 135–145)
Total Bilirubin: 0.6 mg/dL (ref 0.3–1.2)
Total Protein: 7.3 g/dL (ref 6.5–8.1)

## 2021-11-24 LAB — CBC
HCT: 42.9 % (ref 39.0–52.0)
Hemoglobin: 14.1 g/dL (ref 13.0–17.0)
MCH: 28 pg (ref 26.0–34.0)
MCHC: 32.9 g/dL (ref 30.0–36.0)
MCV: 85.1 fL (ref 80.0–100.0)
Platelets: 227 10*3/uL (ref 150–400)
RBC: 5.04 MIL/uL (ref 4.22–5.81)
RDW: 12.9 % (ref 11.5–15.5)
WBC: 5.6 10*3/uL (ref 4.0–10.5)
nRBC: 0 % (ref 0.0–0.2)

## 2021-11-24 MED ORDER — PREDNISONE 10 MG (21) PO TBPK: ORAL_TABLET | ORAL | 0 refills | Status: DC

## 2021-11-24 NOTE — ED Provider Triage Note (Signed)
Emergency Medicine Provider Triage Evaluation Note  Wesley Goodman , a 36 y.o. male  was evaluated in triage.  Pt complains of intermittent left-sided chest pain not provoked with exertion.  Patient states that he had similar pain when he was evaluated on 11/15/2021.  He states that his discomfort seemed to improve at home but returned today while he was out running errands.  Review of Systems  Positive: Patient has chest pain. Negative: No chest tightness or shortness of breath.  Physical Exam  BP (!) 137/100 (BP Location: Right Arm)    Pulse 75    Temp 98.1 F (36.7 C) (Oral)    Resp 19    Ht 5\' 9"  (1.753 m)    Wt 108.9 kg    SpO2 98%    BMI 35.44 kg/m  Gen:   Awake, no distress   Resp:  Normal effort  MSK:   Moves extremities without difficulty  Other:    Medical Decision Making  Medically screening exam initiated at 5:45 PM.  Appropriate orders placed.  Wesley Goodman was informed that the remainder of the evaluation will be completed by another provider, this initial triage assessment does not replace that evaluation, and the importance of remaining in the ED until their evaluation is complete.     Wesley Goodman, [place] 11/24/21 1746

## 2021-11-24 NOTE — Discharge Instructions (Signed)
Take tapered steroid as directed for cough/bronchitis.

## 2021-11-24 NOTE — ED Provider Notes (Signed)
[facility] Provider Note  Patient Contact: 9:12 PM (approximate)   History   Chest Pain   HPI  Wesley Goodman is a 36 y.o. male with an unremarkable past medical history complains of intermittent left-sided chest pain not provoked with exertion.  Patient states that he had similar pain when he was evaluated on 11/15/2021.  Patient had a reassuring cardiac work-up with negative D-dimer and prior emergency department encounter.  Patient states that his symptoms but returned today while he was out running errands.  He denies shortness of breath, nausea, vomiting or abdominal pain.  No new stress.      Physical Exam   Triage Vital Signs: ED Triage Vitals  Enc Vitals Group     BP 11/24/21 1740 (!) 137/100     Pulse Rate 11/24/21 1740 75     Resp 11/24/21 1740 19     Temp 11/24/21 1740 98.1 F (36.7 C)     Temp Source 11/24/21 1740 Oral     SpO2 11/24/21 1740 98 %     Weight 11/24/21 1744 240 lb (108.9 kg)     Height 11/24/21 1744 5\' 9"  (1.753 m)     Head Circumference --      Peak Flow --      Pain Score 11/24/21 1743 6     Pain Loc --      Pain Edu? --      Excl. in GC? --     Most recent vital signs: Vitals:   11/24/21 2000 11/24/21 2030  BP: 118/78 113/76  Pulse: 61   Resp:    Temp:    SpO2: 97%      General: Alert and in no acute distress. Eyes:  PERRL. EOMI. Head: No acute traumatic findings ENT:      Ears: Tms are pearly.       Nose: No congestion/rhinnorhea.      Mouth/Throat: Mucous membranes are moist. Neck: No stridor. No cervical spine tenderness to palpation. Cardiovascular:  Good peripheral perfusion Respiratory: Normal respiratory effort without tachypnea or retractions. Lungs CTAB. Good air entry to the bases with no decreased or absent breath sounds. Gastrointestinal: Bowel sounds 4 quadrants. Soft and nontender to palpation. No guarding or rigidity. No palpable masses. No distention. No CVA tenderness. Musculoskeletal:  Full range of motion to all extremities.  Neurologic:  No gross focal neurologic deficits are appreciated.  Skin:   No rash noted    ED Results / Procedures / Treatments   Labs (all labs ordered are listed, but only abnormal results are displayed) Labs Reviewed  COMPREHENSIVE METABOLIC PANEL - Abnormal; Notable for the following components:      Result Value   Glucose, Bld 100 (*)    All other components within normal limits  CBC  TROPONIN I (HIGH SENSITIVITY)  TROPONIN I (HIGH SENSITIVITY)     EKG     RADIOLOGY   personally viewed and evaluated these images as part of my medical decision making, as well as reviewing the written report by the radiologist.  ED Provider Interpretation: I personally reviewed chest x-ray.  No consolidations, opacities, infiltrates or evidence of pneumothorax.   PROCEDURES:  Critical Care performed: No  Procedures   MEDICATIONS ORDERED IN ED: Medications - No data to display   IMPRESSION / MDM / ASSESSMENT AND PLAN / ED COURSE  I reviewed the triage vital signs and the nursing notes.  Assessment and plan Nonspecific chest pain 36 year old male presents to the emergency department with left-sided chest discomfort that started while patient was running errands.  Vital signs are reassuring at triage.  On physical exam, patient was alert, active and nontoxic-appearing.  Patient's PERC score was 0 and patient had a D-dimer on 11/15/2021 which was within range.  Delta troponin within range.  EKG indicated normal sinus rhythm without ST segment elevation or other apparent arrhythmia.   Chest x-ray showed no evidence of pneumothorax or other concerning findings.  Recommended cardiology follow-up.  Patient has had persistent cough and I did prescribe patient tapered prednisone to cover him for bronchitis.  Return precautions were given to return with new or worsening symptoms.  All patient questions were  answered.  FINAL CLINICAL IMPRESSION(S) / ED DIAGNOSES   Final diagnoses:  Chest pain, unspecified type     Rx / DC Orders   ED Discharge Orders          Ordered    predniSONE (STERAPRED UNI-PAK 21 TAB) 10 MG (21) TBPK tablet        11/24/21 2224             Note:  This document was prepared using Dragon voice recognition software and may include unintentional dictation errors.   Pia Mau [place], PA-C 11/24/21 2319    Georga Hacking, MD 11/28/21 [phone]

## 2021-11-24 NOTE — ED Notes (Signed)
EKG completed. Pt reports CP; "tightness" in chest that started while walking; denies diaphoresis; skin dry currently; reports still feels SOB; resp reg/unlabored currently; in NAD. Reports is wondering if it is "anxiety attacks"; denies history or taking meds for anxiety.

## 2022-07-24 ENCOUNTER — Other Ambulatory Visit: Payer: Self-pay

## 2022-07-24 ENCOUNTER — Emergency Department
Admission: EM | Admit: 2022-07-24 | Discharge: 2022-07-24 | Disposition: A | Discharge status: Home / Self Care | Payer: Medicaid Other | Attending: Emergency Medicine | Admitting: Emergency Medicine

## 2022-07-24 ENCOUNTER — Emergency Department: Payer: Medicaid Other

## 2022-07-24 DIAGNOSIS — Z1152 Encounter for screening for COVID-19: Secondary | ICD-10-CM | POA: Diagnosis not present

## 2022-07-24 DIAGNOSIS — B349 Viral infection, unspecified: Secondary | ICD-10-CM | POA: Diagnosis not present

## 2022-07-24 DIAGNOSIS — R0981 Nasal congestion: Secondary | ICD-10-CM

## 2022-07-24 LAB — RESP PANEL BY RT-PCR (FLU A&B, COVID) ARPGX2
Influenza A by PCR: NEGATIVE
Influenza B by PCR: NEGATIVE
SARS Coronavirus 2 by RT PCR: NEGATIVE

## 2022-07-24 MED ORDER — ONDANSETRON 4 MG PO TBDP: 4.0000 mg | ORAL_TABLET | Freq: Three times a day (TID) | ORAL | 0 refills | Status: AC | PRN

## 2022-07-24 MED ORDER — PREDNISONE 20 MG PO TABS: 40.0000 mg | ORAL_TABLET | Freq: Every day | ORAL | 0 refills | Status: AC

## 2022-07-24 NOTE — ED Triage Notes (Signed)
Pt states he started having cough and congestion since Thursday- pt states his kids at home are also sick

## 2022-07-24 NOTE — Discharge Instructions (Signed)
Take steroids to help with the small rash you have on your upper back.  I suspect this is more likely viral versus an allergic reaction.  However if you develop fevers, worsening symptoms or any other concerns return to ER immediately for repeat evaluation.  We have given you some Zofran to help with nausea.

## 2022-07-24 NOTE — ED Provider Notes (Signed)
[facility] Provider Note    Event Date/Time   First MD Initiated Contact with Patient 07/24/22 [phone]     (approximate)   History   Nasal Congestion   HPI  Wesley Goodman is a 36 y.o. male otherwise healthy who comes in with concerns for infection.  Patient reports having a cough and congestion that started on Thursday.  Does report that his kids are sick at home as well.  Does have + nausea.  Denies any vomiting, diarrhea.  He reports needing a note for work.  He denies any tick bites.  He denies any lesions inside of his mouth or other issues.  He denies any sore throat.  Physical Exam   Triage Vital Signs: ED Triage Vitals  Enc Vitals Group     BP 07/24/22 0849 (!) 167/110     Pulse Rate 07/24/22 0849 80     Resp 07/24/22 0849 18     Temp 07/24/22 0849 98.4 F (36.9 C)     Temp Source 07/24/22 0849 Oral     SpO2 07/24/22 0849 100 %     Weight 07/24/22 0850 290 lb (131.5 kg)     Height 07/24/22 0850 5\' 9"  (1.753 m)     Head Circumference --      Peak Flow --      Pain Score 07/24/22 0857 0     Pain Loc --      Pain Edu? --      Excl. in [place]? --     Most recent vital signs: Vitals:   07/24/22 0849  BP: (!) 167/110  Pulse: 80  Resp: 18  Temp: 98.4 F (36.9 C)  SpO2: 100%     General: Awake, no distress.  Oropharynx is clear without any exudates.  Uvula is midline. CV:  Good peripheral perfusion.  Resp:  Normal effort.  Abd:  No distention.  Soft and nontender Other:  Patient has a little bit of a red rash noted to the upper back that are not petechial in nature.  They are red nonraised blanchable.  Some of that are associated with some back acne   ED Results / Procedures / Treatments   Labs (all labs ordered are listed, but only abnormal results are displayed) Labs Reviewed  RESP PANEL BY RT-PCR (FLU A&B, COVID) ARPGX2     EKG  My interpretation of EKG:  I reviewed patient's blood work from 2/14 where he had normal  creatinine.  RADIOLOGY I have reviewed the xray personally and interpreted and no evidence of any pneumonia  PROCEDURES:  Critical Care performed: No  Procedures   MEDICATIONS ORDERED IN ED: Medications - No data to display   IMPRESSION / MDM / [place] / ED COURSE  I reviewed the triage vital signs and the nursing notes.   Patient's presentation is most consistent with acute, uncomplicated illness.   Patient instantly noted to be hypertensive.  Recommended follow-up with a primary care doctor for a recheck.  Differential includes viral, pneumonia, COVID.  No sore throat to suggest strep, peritonsillar abscess.  Little bit of a red rash on his upper back.  Denies any known new detergents.  No fever no oral lesions to suggest more serious STS, TSS, or petechia-  Denies any lesions on his penis.  This looks more like an allergic reaction versus viral rash.  We will give a course of steroids, recommended Benadryl to help with the congestion and some Zofran to  help with nausea.  Patient requesting a note from work which I will provide and he can follow-up with his PCP for blood pressure recheck and return to the ER if he develops worsening symptoms or any other concerns     FINAL CLINICAL IMPRESSION(S) / ED DIAGNOSES   Final diagnoses:  Nasal congestion  Viral illness     Rx / DC Orders   ED Discharge Orders          Ordered    predniSONE (DELTASONE) 20 MG tablet  Daily with breakfast        07/24/22 1018    ondansetron (ZOFRAN-ODT) 4 MG disintegrating tablet  Every 8 hours PRN        07/24/22 1018             Note:  This document was prepared using Dragon voice recognition software and may include unintentional dictation errors.   Concha Se, MD 07/24/22 1019

## 2022-12-02 IMAGING — CR DG CHEST 2V
1 series · 2 of 2 positions shown · non-contrast
Comparison: 11/15/2021

CLINICAL DATA: Chest pain

EXAM:
CHEST - 2 VIEW

[Series 1: dg chest 2 view · 0.14mm/px · 2 of 2 slices shown]
[im 1/2]
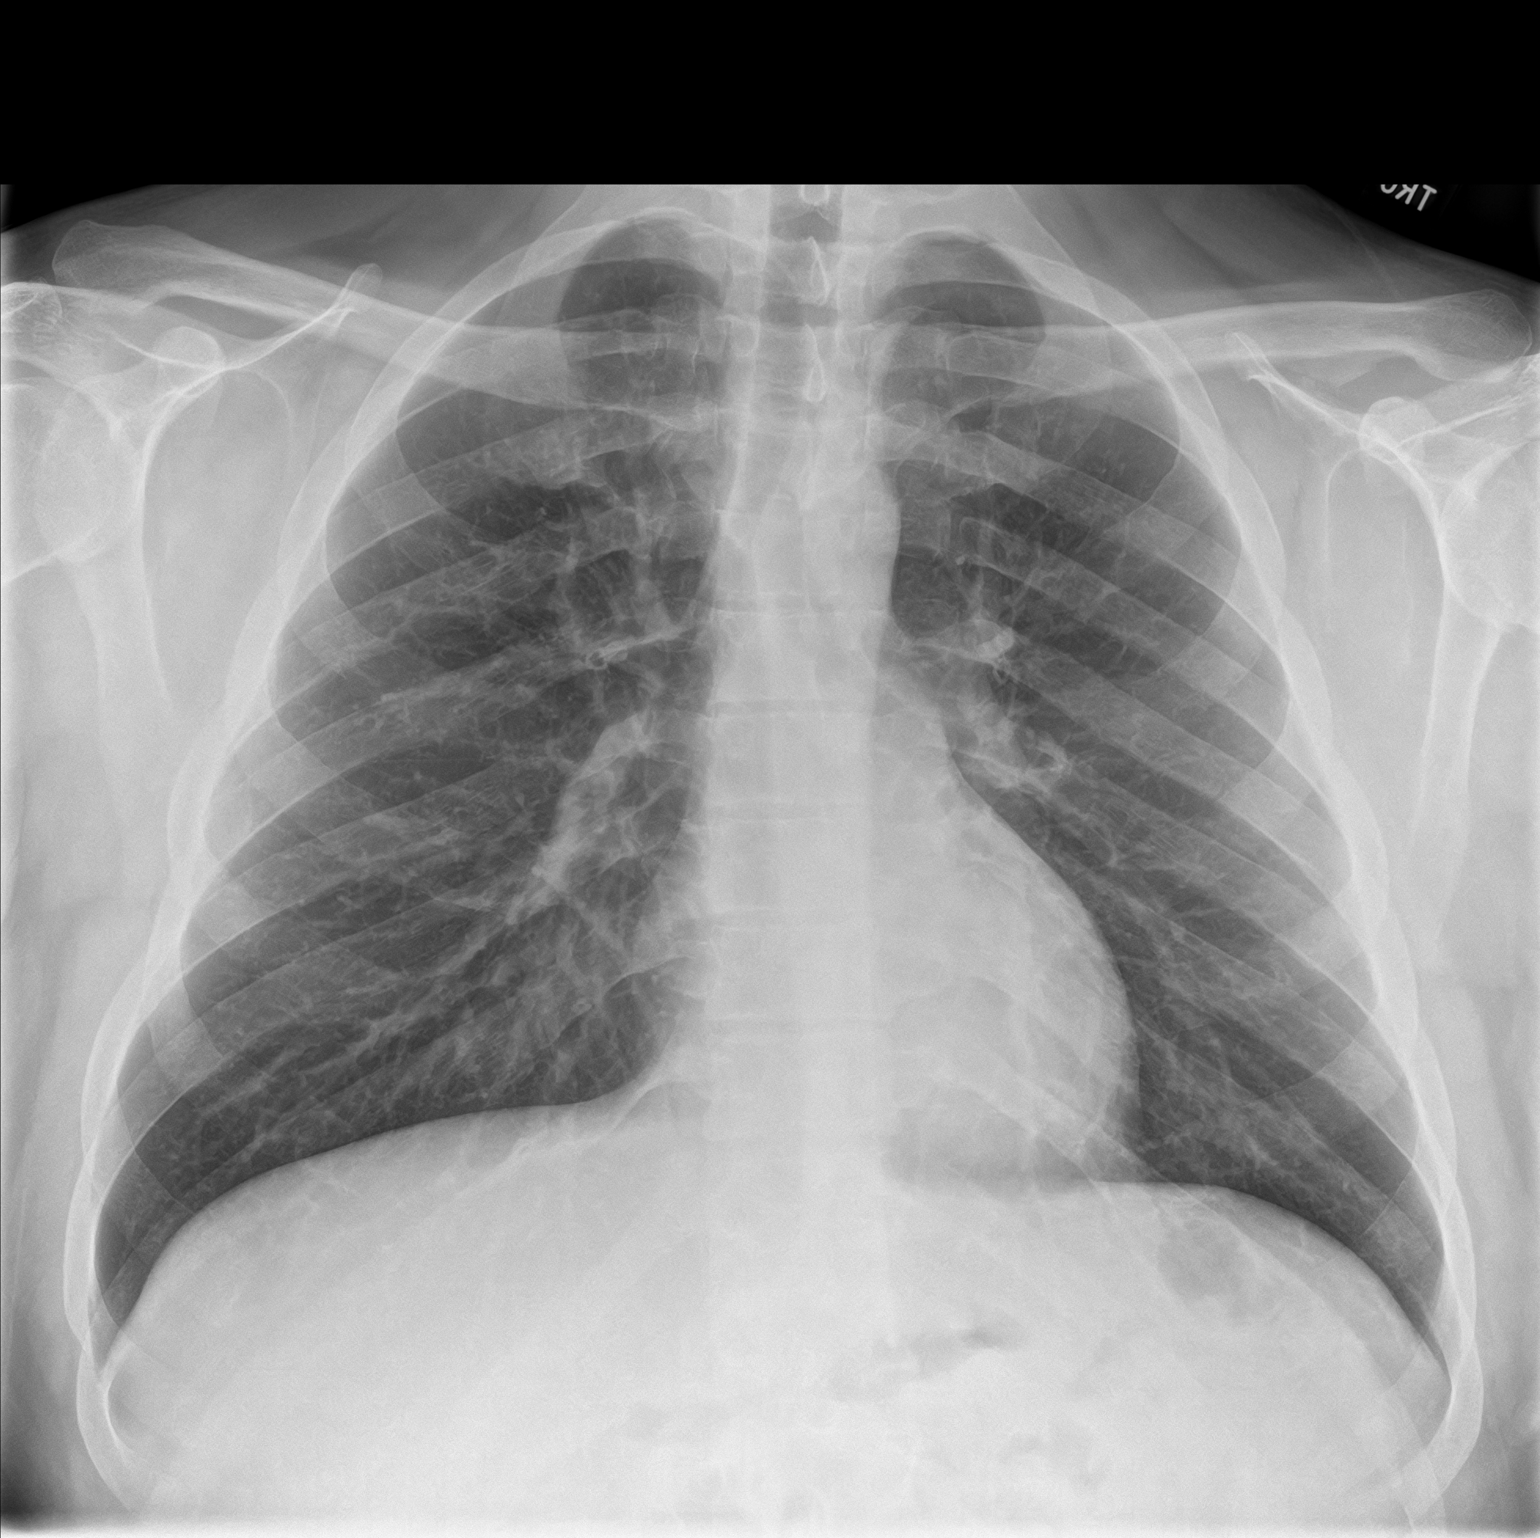
[im 2/2]
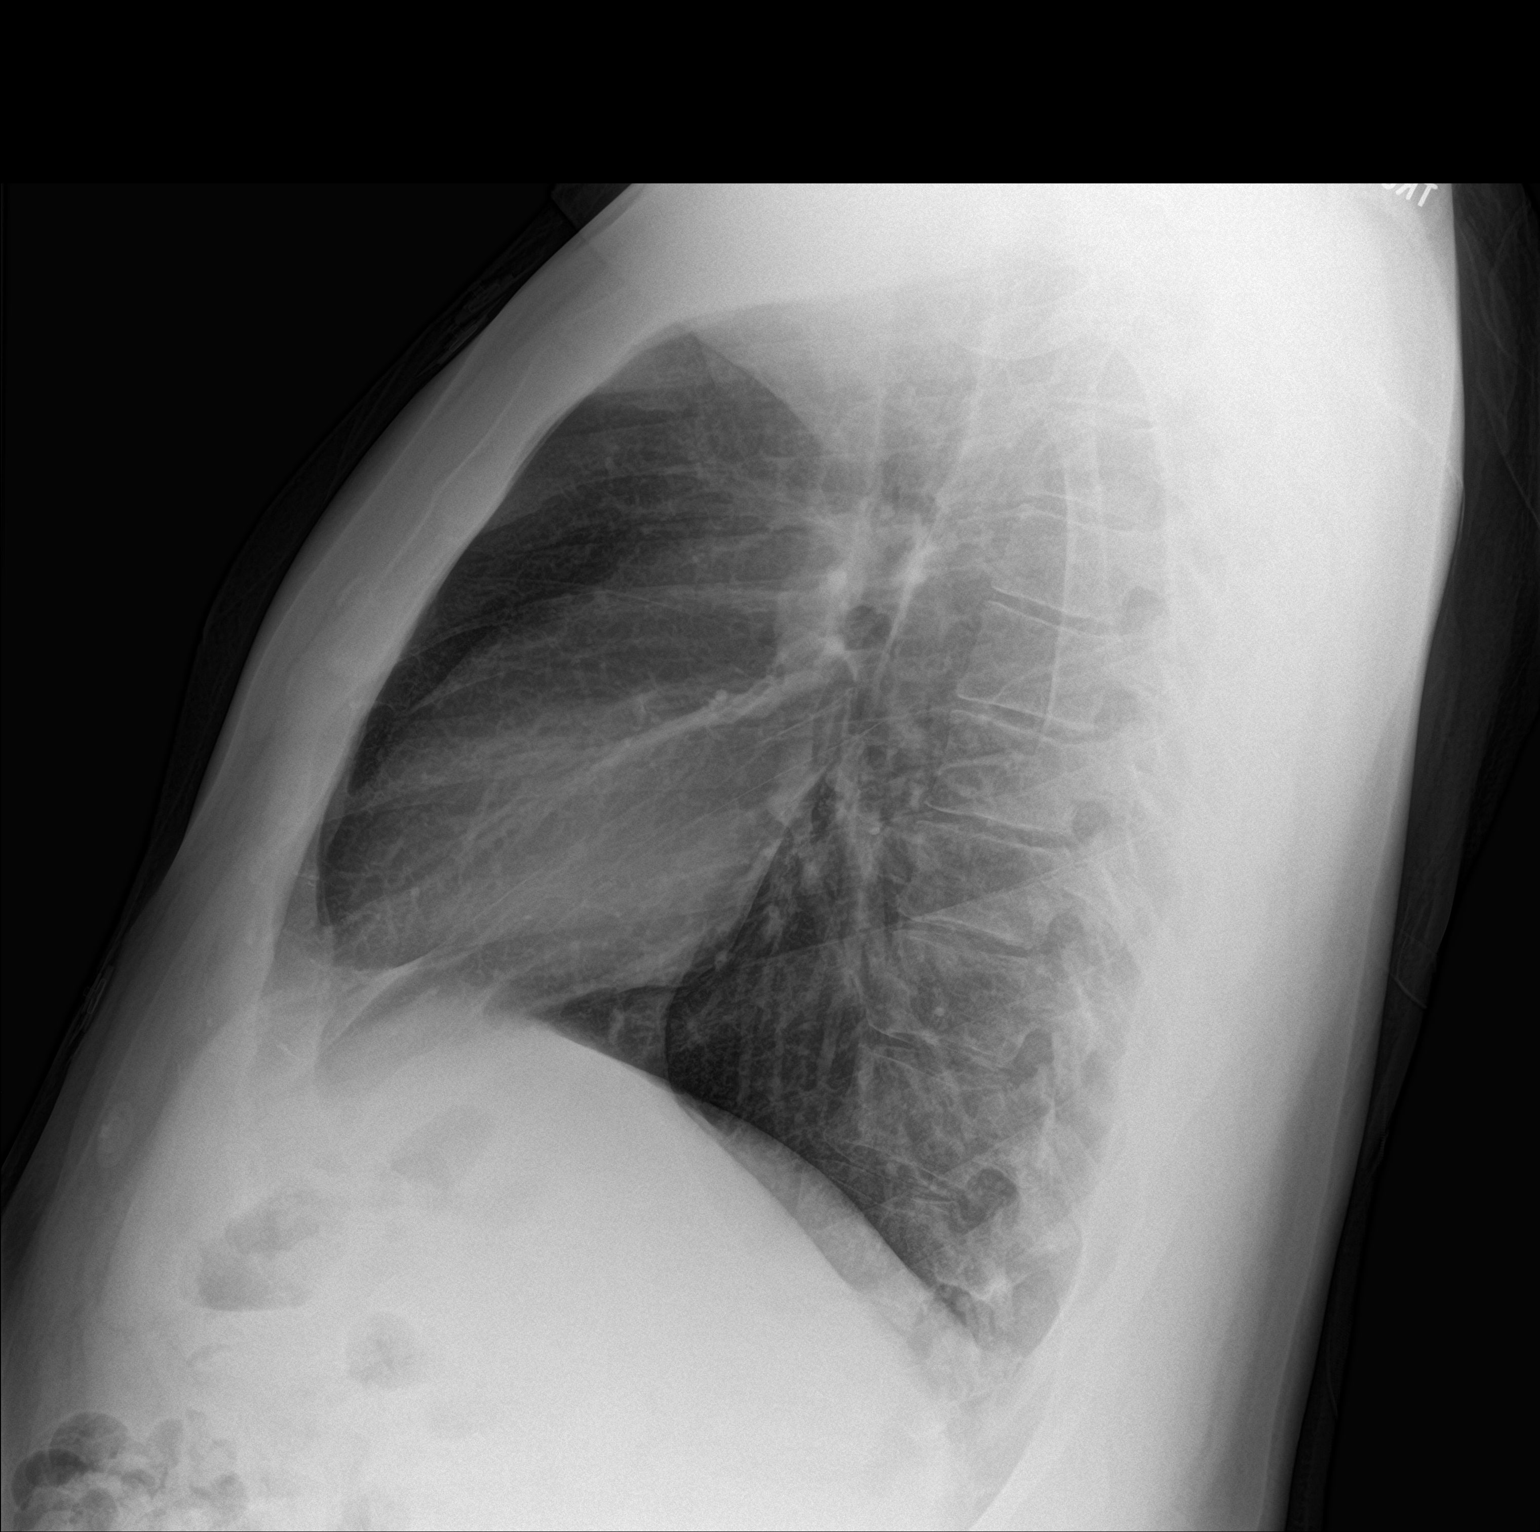

[2 of 2 positions shown; findings below may reference images not displayed]

FINDINGS: The heart size and mediastinal contours are within normal limits.
Both lungs are clear. The visualized skeletal structures are
unremarkable.
IMPRESSION: No acute cardiopulmonary disease.

## 2023-06-28 ENCOUNTER — Emergency Department: Payer: Medicaid Other

## 2023-06-28 ENCOUNTER — Encounter: Payer: Self-pay | Admitting: Emergency Medicine

## 2023-06-28 ENCOUNTER — Emergency Department
Admission: EM | Admit: 2023-06-28 | Discharge: 2023-06-28 | Disposition: A | Discharge status: Home / Self Care | Payer: Medicaid Other | Attending: Emergency Medicine | Admitting: Emergency Medicine

## 2023-06-28 ENCOUNTER — Other Ambulatory Visit: Payer: Self-pay

## 2023-06-28 DIAGNOSIS — Z20822 Contact with and (suspected) exposure to covid-19: Secondary | ICD-10-CM | POA: Diagnosis not present

## 2023-06-28 DIAGNOSIS — B349 Viral infection, unspecified: Secondary | ICD-10-CM | POA: Diagnosis not present

## 2023-06-28 DIAGNOSIS — R059 Cough, unspecified: Secondary | ICD-10-CM | POA: Diagnosis present

## 2023-06-28 LAB — SARS CORONAVIRUS 2 BY RT PCR: SARS Coronavirus 2 by RT PCR: NEGATIVE

## 2023-06-28 MED ORDER — KETOROLAC TROMETHAMINE 60 MG/2ML IM SOLN
30.0000 mg | Freq: Once | INTRAMUSCULAR | Status: AC
  Administered 2023-06-28: 30 mg via INTRAMUSCULAR
  Filled 2023-06-28: qty 2

## 2023-06-28 MED ORDER — ONDANSETRON 4 MG PO TBDP
4.0000 mg | ORAL_TABLET | Freq: Once | ORAL | Status: AC
  Administered 2023-06-28: 4 mg via ORAL
  Filled 2023-06-28: qty 1

## 2023-06-28 MED ORDER — ONDANSETRON 4 MG PO TBDP: 4.0000 mg | ORAL_TABLET | Freq: Three times a day (TID) | ORAL | 0 refills | Status: DC | PRN

## 2023-06-28 NOTE — ED Provider Notes (Signed)
[facility] Provider Note    Event Date/Time   First MD Initiated Contact with Patient 06/28/23 0153     (approximate)   History   Nasal Congestion   HPI  Wesley Goodman is a 37 y.o. male who presents to the ED from home with the other 3 members of his family with a chief complaint of cold-like symptoms.  Patient was diagnosed with influenza B on the 13th by his PCP.  Placed on supportive treatment.  Persists with cough and sinus congestion, occasional nausea without vomiting.  Denies fever/chills, chest pain, shortness of breath, abdominal pain, diarrhea.     Past Medical History  History reviewed. No pertinent past medical history.   Active Problem List  There are no problems to display for this patient.    Past Surgical History  History reviewed. No pertinent surgical history.   Home Medications   Prior to Admission medications   Medication Sig Start Date End Date Taking? Authorizing Provider  ondansetron (ZOFRAN-ODT) 4 MG disintegrating tablet Take 1 tablet (4 mg total) by mouth every 8 (eight) hours as needed for nausea or vomiting. 06/28/23  Yes Irean Hong, MD  albuterol (VENTOLIN HFA) 108 (90 Base) MCG/ACT inhaler Inhale 2 puffs into the lungs every 6 (six) hours as needed for wheezing or shortness of breath. 09/01/21   Arnaldo Natal, MD  cyclobenzaprine (FLEXERIL) 10 MG tablet Take 1 tablet (10 mg total) by mouth 3 (three) times daily as needed for muscle spasms. 06/02/16   Beers, Charmayne Sheer, PA-C  HYDROcodone-acetaminophen (NORCO) 5-325 MG tablet Take 1-2 tablets by mouth every 4 (four) hours as needed for moderate pain. 06/02/16   Beers, Charmayne Sheer, PA-C  ibuprofen (ADVIL,MOTRIN) 800 MG tablet Take 1 tablet (800 mg total) by mouth every 8 (eight) hours as needed. 06/02/16   Evangeline Dakin, PA-C     Allergies  Patient has no known allergies.   Family History  History reviewed. No pertinent family history.   Physical Exam  Triage  Vital Signs: ED Triage Vitals  Encounter Vitals Group     BP 06/28/23 0130 (!) 168/104     Systolic BP Percentile --      Diastolic BP Percentile --      Pulse Rate 06/28/23 0130 79     Resp 06/28/23 0130 18     Temp 06/28/23 0130 98.5 F (36.9 C)     Temp Source 06/28/23 0130 Oral     SpO2 06/28/23 0130 98 %     Weight 06/28/23 0107 239 lb (108.4 kg)     Height 06/28/23 0107 5\' 9"  (1.753 m)     Head Circumference --      Peak Flow --      Pain Score 06/28/23 0107 0     Pain Loc --      Pain Education --      Exclude from Growth Chart --     Updated Vital Signs: BP (!) 168/104 (BP Location: Left Arm)   Pulse 79   Temp 98.5 F (36.9 C) (Oral)   Resp 18   Ht 5\' 9"  (1.753 m)   Wt 108.4 kg   SpO2 98%   BMI 35.29 kg/m    General: Awake, no distress.  CV:  RRR.  Good peripheral perfusion.  Resp:  Normal effort.  CTAB. Abd:  No distention.  Other:  Nasal congestion noted.   ED Results / Procedures / Treatments  Labs (all labs  ordered are listed, but only abnormal results are displayed) Labs Reviewed  SARS CORONAVIRUS 2 BY RT PCR     EKG  None   RADIOLOGY I have independently visualized and interpreted patient's x-ray as well as noted the radiology interpretation:  X-ray: No acute cardiopulmonary process  Official radiology report(s): DG Chest 2 View  Result Date: 06/28/2023 CLINICAL DATA:  Cough and congestion EXAM: CHEST - 2 VIEW COMPARISON:  07/24/2022 FINDINGS: The heart size and mediastinal contours are within normal limits. Both lungs are clear. The visualized skeletal structures are unremarkable. IMPRESSION: No active cardiopulmonary disease. Electronically Signed   By: Alcide Clever M.D.   On: 06/28/2023 02:47     PROCEDURES:  Critical Care performed: No  Procedures   MEDICATIONS ORDERED IN ED: Medications  ondansetron (ZOFRAN-ODT) disintegrating tablet 4 mg (4 mg Oral Given 06/28/23 0231)  ketorolac (TORADOL) injection 30 mg (30 mg  Intramuscular Given 06/28/23 0231)     IMPRESSION / MDM / ASSESSMENT AND PLAN / ED COURSE  I reviewed the triage vital signs and the nursing notes.                             37 year old male diagnosed with influenza B 4 days ago who presents with continued viral symptoms.  Will obtain chest x-ray to evaluate for secondary pneumonia.  COVID swab is pending.  Patient's presentation is most consistent with acute, uncomplicated illness.  0308 COVID swab and chest x-ray are negative.  Will prescribe Zofran as needed.  Patient has cough medicine and albuterol inhaler prescribed already by his previous provider.  Strict return precautions given.  Patient verbalizes understanding and agrees with plan of care.  FINAL CLINICAL IMPRESSION(S) / ED DIAGNOSES   Final diagnoses:  Viral illness     Rx / DC Orders   ED Discharge Orders          Ordered    ondansetron (ZOFRAN-ODT) 4 MG disintegrating tablet  Every 8 hours PRN        06/28/23 0243             Note:  This document was prepared using Dragon voice recognition software and may include unintentional dictation errors.   Irean Hong, MD 06/28/23 [phone]

## 2023-06-28 NOTE — ED Triage Notes (Signed)
Patient ambulatory to triage with steady gait, without difficulty or distress noted; pt reports persistent nonprod cough, sinus congestion; st dx with flu on 13th; pt here with 3 other household members with same symptoms

## 2023-06-28 NOTE — ED Notes (Signed)
Patient denies pain and is resting comfortably.  

## 2023-06-28 NOTE — Discharge Instructions (Addendum)
You may take the medicines already prescribed to you by your doctor.  You may take Zofran as needed for nausea.  Return to the ER for worsening symptoms, persistent vomiting, difficulty breathing or other concerns.

## 2023-11-19 ENCOUNTER — Emergency Department
Admission: EM | Admit: 2023-11-19 | Discharge: 2023-11-19 | Disposition: A | Discharge status: Home / Self Care | Payer: Medicaid Other | Attending: Emergency Medicine | Admitting: Emergency Medicine

## 2023-11-19 ENCOUNTER — Other Ambulatory Visit: Payer: Self-pay

## 2023-11-19 DIAGNOSIS — J101 Influenza due to other identified influenza virus with other respiratory manifestations: Secondary | ICD-10-CM | POA: Insufficient documentation

## 2023-11-19 DIAGNOSIS — Z20822 Contact with and (suspected) exposure to covid-19: Secondary | ICD-10-CM | POA: Diagnosis not present

## 2023-11-19 DIAGNOSIS — R059 Cough, unspecified: Secondary | ICD-10-CM | POA: Diagnosis present

## 2023-11-19 LAB — RESP PANEL BY RT-PCR (RSV, FLU A&B, COVID)  RVPGX2
Influenza A by PCR: POSITIVE — AB
Influenza B by PCR: NEGATIVE
Resp Syncytial Virus by PCR: NEGATIVE
SARS Coronavirus 2 by RT PCR: NEGATIVE

## 2023-11-19 MED ORDER — ONDANSETRON 4 MG PO TBDP: 4.0000 mg | ORAL_TABLET | Freq: Three times a day (TID) | ORAL | 0 refills | Status: AC | PRN

## 2023-11-19 MED ORDER — ACETAMINOPHEN 500 MG PO TABS
1000.0000 mg | ORAL_TABLET | Freq: Once | ORAL | Status: AC
  Administered 2023-11-19: 1000 mg via ORAL
  Filled 2023-11-19: qty 2

## 2023-11-19 MED ORDER — ACETAMINOPHEN 500 MG PO TABS
ORAL_TABLET | ORAL | Status: AC
  Filled 2023-11-19: qty 1

## 2023-11-19 MED ORDER — ONDANSETRON 4 MG PO TBDP
4.0000 mg | ORAL_TABLET | Freq: Once | ORAL | Status: AC
  Administered 2023-11-19: 4 mg via ORAL
  Filled 2023-11-19: qty 1

## 2023-11-19 NOTE — ED Notes (Signed)
PO challenged with water. Tolerated well.

## 2023-11-19 NOTE — ED Triage Notes (Signed)
 Pt to ed from home via POV for flu like symptoms that started yesterday. Pt children are also here for same symptoms. Nausea and vomiting. Pt is caox4, and in no acute distress.

## 2023-11-19 NOTE — Discharge Instructions (Signed)
Please take Tylenol and ibuprofen/Advil for your pain.  It is safe to take them together, or to alternate them every few hours.  Take up to 1000mg of Tylenol at a time, up to 4 times per day.  Do not take more than 4000 mg of Tylenol in 24 hours.  For ibuprofen, take 400-600 mg, 3 - 4 times per day.  

## 2023-11-19 NOTE — ED Provider Notes (Signed)
 [facility] Provider Note    Event Date/Time   First MD Initiated Contact with Patient 11/19/23 2050     (approximate)   History   Flu Like SYmptoms   HPI  Wesley Goodman is a 38 y.o. male who presents to the ED for evaluation of Flu Like SYmptoms   Patient presents to the ED alongside his wife and 2 children.  Both kids also check in for the same symptoms.  Whole family has been sick for the past 1 day with emesis.  Patient reports diffuse myalgias, dry cough, poor intake.  No abdominal pain or syncope.   Physical Exam   Triage Vital Signs: ED Triage Vitals  Encounter Vitals Group     BP 11/19/23 2049 (!) 128/91     Systolic BP Percentile --      Diastolic BP Percentile --      Pulse Rate 11/19/23 2049 98     Resp 11/19/23 2049 18     Temp 11/19/23 2049 98.1 F (36.7 C)     Temp Source 11/19/23 2049 Oral     SpO2 11/19/23 2049 98 %     Weight 11/19/23 2048 236 lb (107 kg)     Height 11/19/23 2048 5' 9 (1.753 m)     Head Circumference --      Peak Flow --      Pain Score 11/19/23 2047 0     Pain Loc --      Pain Education --      Exclude from Growth Chart --     Most recent vital signs: Vitals:   11/19/23 2049  BP: (!) 128/91  Pulse: 98  Resp: 18  Temp: 98.1 F (36.7 C)  SpO2: 98%    General: Awake, no distress.  Occasional dry cough CV:  Good peripheral perfusion.  Resp:  Normal effort.  Abd:  No distention.  Soft and benign MSK:  No deformity noted.  Neuro:  No focal deficits appreciated. Other:     ED Results / Procedures / Treatments   Labs (all labs ordered are listed, but only abnormal results are displayed) Labs Reviewed  RESP PANEL BY RT-PCR (RSV, FLU A&B, COVID)  RVPGX2 - Abnormal; Notable for the following components:      Result Value   Influenza A by PCR POSITIVE (*)    All other components within normal limits    EKG   RADIOLOGY   Official radiology report(s): No results found.  PROCEDURES and  INTERVENTIONS:  Procedures  Medications  ondansetron  (ZOFRAN -ODT) disintegrating tablet 4 mg (4 mg Oral Given 11/19/23 2110)  acetaminophen  (TYLENOL ) tablet 1,000 mg (1,000 mg Oral Given 11/19/23 2109)  acetaminophen  (TYLENOL ) 500 MG tablet (  Given 11/19/23 2125)     IMPRESSION / MDM / ASSESSMENT AND PLAN / ED COURSE  I reviewed the triage vital signs and the nursing notes.  Differential diagnosis includes, but is not limited to, viral illness, sepsis, rhabdo  {Patient presents with symptoms of an acute illness or injury that is potentially life-threatening.  Patient presents alongside his whole family who check soon for the same syndrome of emesis in the past day.  Suspect viral syndrome.  Provide antiemetics, Tylenol  and test for flu/COVID/RSV.  Clinical Course as of 11/19/23 2226  Sat Nov 19, 2023  2226 Reassessed, feeling better.  Discussed influenza, expectant management and return precautions [DS]    Clinical Course User Index [DS] Claudene Rover, MD     FINAL  CLINICAL IMPRESSION(S) / ED DIAGNOSES   Final diagnoses:  Influenza A     Rx / DC Orders   ED Discharge Orders          Ordered    ondansetron  (ZOFRAN -ODT) 4 MG disintegrating tablet  Every 8 hours PRN        11/19/23 2218             Note:  This document was prepared using Dragon voice recognition software and may include unintentional dictation errors.   Claudene Rover, MD 11/19/23 2226

## 2024-01-08 ENCOUNTER — Emergency Department
Admission: EM | Admit: 2024-01-08 | Discharge: 2024-01-08 | Disposition: A | Discharge status: Home / Self Care | Attending: Emergency Medicine | Admitting: Emergency Medicine

## 2024-01-08 ENCOUNTER — Other Ambulatory Visit: Payer: Self-pay

## 2024-01-08 DIAGNOSIS — J069 Acute upper respiratory infection, unspecified: Secondary | ICD-10-CM | POA: Diagnosis not present

## 2024-01-08 DIAGNOSIS — R059 Cough, unspecified: Secondary | ICD-10-CM | POA: Diagnosis present

## 2024-01-08 LAB — RESP PANEL BY RT-PCR (RSV, FLU A&B, COVID)  RVPGX2
Influenza A by PCR: NEGATIVE
Influenza B by PCR: NEGATIVE
Resp Syncytial Virus by PCR: NEGATIVE
SARS Coronavirus 2 by RT PCR: NEGATIVE

## 2024-01-08 NOTE — Discharge Instructions (Addendum)
 Take OTC Delsym for cough relief. Consider OTC steroid nasal sprays (Nasonex, Flonase, etc) and decongestants like pseudoephedrine. Follow-up with a local urgent care or select a local provider.  Please go to the following website to schedule new (and existing) patient appointments:   [email or web address]   The following is a list of primary care offices in the area who are accepting new patients at this time.  Please reach out to one of them directly and let them know you would like to schedule an appointment to follow up on an Emergency Department visit, and/or to establish a new primary care provider (PCP).  There are likely other primary care clinics in the are who are accepting new patients, but this is an excellent place to start:  [facility] Lead physician: Dr Shirlee Latch [address] [place], [place] [address] [phone]  [facility] Lead Physician: Dr Alba Cory [address], [place], [place] [address] [phone]  [facility]  Lead Physician: Dr Olevia Perches [address] [place], [place] [address] [phone]  [facility] Lead Physician: Dr Sofie Hartigan [address] [place], [place] [address] [phone]  [facility] Primary [facility] & Sports Medicine at [facility] Lead Physician: Dr Bari Edward [address] [place], [place], [place] [address] [phone]

## 2024-01-08 NOTE — ED Triage Notes (Signed)
 Pt arrives with c/o congestion, runny nose, headaches, and cough that started a few days ago. Pt denies fevers.

## 2024-01-08 NOTE — ED Provider Notes (Signed)
    [facility] Emergency Department Provider Note     Event Date/Time   First MD Initiated Contact with Patient 01/08/24 2136     (approximate)   History   URI   HPI  Wesley Goodman is a 38 y.o. male with a non-contributory medical history, presents for evaluation of several days of cough, congestion, runny nose and mild headaches. He denies FCS, NVD, or sick contacts.    Physical Exam   Triage Vital Signs: ED Triage Vitals [01/08/24 2016]  Encounter Vitals Group     BP (!) 148/99     Systolic BP Percentile      Diastolic BP Percentile      Pulse Rate 72     Resp 18     Temp 98 F (36.7 C)     Temp Source Oral     SpO2 99 %     Weight 240 lb (108.9 kg)     Height      Head Circumference      Peak Flow      Pain Score 6     Pain Loc      Pain Education      Exclude from Growth Chart     Most recent vital signs: Vitals:   01/08/24 2016  BP: (!) 148/99  Pulse: 72  Resp: 18  Temp: 98 F (36.7 C)  SpO2: 99%    General Awake, no distress. NAD HEENT NCAT. PERRL. EOMI. No rhinorrhea. Mucous membranes are moist.  CV:  Good peripheral perfusion. RRR RESP:  Normal effort. CTA ABD:  No distention.    ED Results / Procedures / Treatments   Labs (all labs ordered are listed, but only abnormal results are displayed) Labs Reviewed  RESP PANEL BY RT-PCR (RSV, FLU A&B, COVID)  RVPGX2    EKG   RADIOLOGY   No results found.   PROCEDURES:  Critical Care performed: No  Procedures   MEDICATIONS ORDERED IN ED: Medications - No data to display   IMPRESSION / MDM / ASSESSMENT AND PLAN / ED COURSE  I reviewed the triage vital signs and the nursing notes.                              Differential diagnosis includes, but is not limited to, COVID, flu, RSV, viral URI, viral gastroenteritis  Patient's presentation is most consistent with acute complicated illness / injury requiring diagnostic workup.   Patient's diagnosis is  consistent with likely viral URI.  Stable and reassuring exam at this time.  Viral panel test is negative.  Patient will be discharged home with instructions to take OTC dextromethorphan and pseudoephedrine. Patient is to follow up with primary provider or local urgent care as discussed as needed or otherwise directed. Patient is given ED precautions to return to the ED for any worsening or new symptoms.   FINAL CLINICAL IMPRESSION(S) / ED DIAGNOSES   Final diagnoses:  Viral URI with cough     Rx / DC Orders   ED Discharge Orders     None        Note:  This document was prepared using Dragon voice recognition software and may include unintentional dictation errors.    Lissa Hoard, PA-C 01/08/24 2354    Claybon Jabs, MD 01/09/24 [phone]

## 2024-10-31 ENCOUNTER — Emergency Department
Admission: EM | Admit: 2024-10-31 | Discharge: 2024-10-31 | Disposition: A | Discharge status: Home / Self Care | Payer: Self-pay

## 2024-10-31 ENCOUNTER — Other Ambulatory Visit: Payer: Self-pay

## 2024-10-31 ENCOUNTER — Emergency Department: Payer: Self-pay

## 2024-10-31 DIAGNOSIS — J069 Acute upper respiratory infection, unspecified: Secondary | ICD-10-CM | POA: Insufficient documentation

## 2024-10-31 DIAGNOSIS — R0981 Nasal congestion: Secondary | ICD-10-CM

## 2024-10-31 LAB — RESP PANEL BY RT-PCR (RSV, FLU A&B, COVID)  RVPGX2
Influenza A by PCR: NEGATIVE
Influenza B by PCR: NEGATIVE
Resp Syncytial Virus by PCR: NEGATIVE
SARS Coronavirus 2 by RT PCR: NEGATIVE

## 2024-10-31 NOTE — ED Triage Notes (Signed)
 Pt reports for the past 2 days he has had a a lot of nasal congestion and cough. Pt reports yellow sputum.

## 2024-10-31 NOTE — ED Provider Notes (Signed)
 "  [facility] Provider Note    Event Date/Time   First MD Initiated Contact with Patient 10/31/24 2157     (approximate)   History   Nasal Congestion   HPI  Wesley Goodman is a 39 y.o. male presenting to the emergency department complaining of a cough and congestion for the last 2 days.  He reports that the cough is productive of yellow sputum.  Reports that his wife and daughter have been sick with similar symptoms.     Physical Exam   Triage Vital Signs: ED Triage Vitals  Encounter Vitals Group     BP 10/31/24 2153 (!) 144/93     Girls Systolic BP Percentile --      Girls Diastolic BP Percentile --      Boys Systolic BP Percentile --      Boys Diastolic BP Percentile --      Pulse Rate 10/31/24 2153 81     Resp 10/31/24 2153 18     Temp 10/31/24 2153 97.8 F (36.6 C)     Temp src --      SpO2 10/31/24 2153 99 %     Weight 10/31/24 2153 216 lb (98 kg)     Height 10/31/24 2153 5' 8 (1.727 m)     Head Circumference --      Peak Flow --      Pain Score 10/31/24 2152 6     Pain Loc --      Pain Education --      Exclude from Growth Chart --     Most recent vital signs: Vitals:   10/31/24 2153  BP: (!) 144/93  Pulse: 81  Resp: 18  Temp: 97.8 F (36.6 C)  SpO2: 99%     General: Awake, no distress.   CV:  Good peripheral perfusion.  Resp:  Normal effort.  Abd:  No distention.  Other:     ED Results / Procedures / Treatments   Labs (all labs ordered are listed, but only abnormal results are displayed) Labs Reviewed  RESP PANEL BY RT-PCR (RSV, FLU A&B, COVID)  RVPGX2     EKG     RADIOLOGY I, Wesley Goodman, personally viewed and evaluated these images (plain radiographs) as part of my medical decision making, as well as reviewing the written report by the radiologist.  Chest x-ray was unremarkable.   PROCEDURES:  Critical Care performed: No  Procedures   MEDICATIONS ORDERED IN ED: Medications - No data to  display   IMPRESSION / MDM / ASSESSMENT AND PLAN / ED COURSE  I reviewed the triage vital signs and the nursing notes.                              Differential diagnosis includes, but is not limited to, viral URI, sinusitis, allergic rhinitis  Patient's presentation is most consistent with acute, uncomplicated illness.  Patient is a 39 year old male presenting to the emergency department complaining of 2 days of cough and nasal congestion.  Chest x-ray was performed and was unremarkable.  Viral swabs were negative.  Discussed with the patient that I do feel his symptoms are likely viral in nature.  We discussed over-the-counter cough and cold medications as well as Tylenol /Motrin  as needed for body aches and fever control.  He will follow-up with his primary care physician within the next few days.  Return to the emergency department for new  or worsening symptoms.      FINAL CLINICAL IMPRESSION(S) / ED DIAGNOSES   Final diagnoses:  Viral upper respiratory tract infection  Nasal congestion     Rx / DC Orders   ED Discharge Orders          Ordered    Ambulatory Referral to Primary Care (Establish Care)        10/31/24 2304             Note:  This document was prepared using Dragon voice recognition software and may include unintentional dictation errors.   Wesley Wesley HERO, MD 10/31/24 2310  "
